# Patient Record
Sex: Male | Born: 1980 | Race: White | Hispanic: No | Marital: Single | Smoking: Current every day smoker
Health system: Southern US, Community
[De-identification: ages and names within clinical notes are randomized; demographics above are authoritative.]

## PROBLEM LIST (undated history)

## (undated) DIAGNOSIS — N200 Calculus of kidney: Secondary | ICD-10-CM

## (undated) DIAGNOSIS — N289 Disorder of kidney and ureter, unspecified: Secondary | ICD-10-CM

---

## 2013-07-08 ENCOUNTER — Emergency Department (HOSPITAL_BASED_OUTPATIENT_CLINIC_OR_DEPARTMENT_OTHER)
Admission: EM | Admit: 2013-07-08 | Discharge: 2013-07-08 | Disposition: A | Discharge status: Home / Self Care | Payer: Self-pay | Attending: Emergency Medicine | Admitting: Emergency Medicine

## 2013-07-08 ENCOUNTER — Encounter (HOSPITAL_BASED_OUTPATIENT_CLINIC_OR_DEPARTMENT_OTHER): Payer: Self-pay | Admitting: Emergency Medicine

## 2013-07-08 DIAGNOSIS — F17200 Nicotine dependence, unspecified, uncomplicated: Secondary | ICD-10-CM | POA: Insufficient documentation

## 2013-07-08 DIAGNOSIS — K029 Dental caries, unspecified: Secondary | ICD-10-CM | POA: Insufficient documentation

## 2013-07-08 DIAGNOSIS — K089 Disorder of teeth and supporting structures, unspecified: Secondary | ICD-10-CM | POA: Insufficient documentation

## 2013-07-08 MED ORDER — PENICILLIN V POTASSIUM 500 MG PO TABS: 500.0000 mg | ORAL_TABLET | Freq: Three times a day (TID) | ORAL | Status: DC

## 2013-07-08 MED ORDER — OXYCODONE-ACETAMINOPHEN 5-325 MG PO TABS: 2.0000 | ORAL_TABLET | ORAL | Status: DC | PRN

## 2013-07-08 NOTE — Discharge Instructions (Signed)
Penicillin as prescribed.  Percocet as needed for as prescribed for pain.  Followup with dentistry as scheduled.   Dental Caries  Dental caries (also called tooth decay) is the most common oral disease. It can occur at any age, but is more common in children and young adults.  HOW DENTAL CARIES DEVELOPS  The process of decay begins when bacteria and foods (particularly sugars and starches) combine in your mouth to produce plaque. Plaque is a substance that sticks to the hard, outer surface of a tooth (enamel). The bacteria in plaque produce acids that attack enamel. These acids may also attack the root surface of a tooth (cementum) if it is exposed. Repeated attacks dissolve these surfaces and create holes in the tooth (cavities). If left untreated, the acids destroy the other layers of the tooth.  RISK FACTORS  Frequent sipping of sugary beverages.   Frequent snacking on sugary and starchy foods, especially those that easily get stuck in the teeth.   Poor oral hygiene.   Dry mouth.   Substance abuse such as methamphetamine abuse.   Broken or poor-fitting dental restorations.   Eating disorders.   Gastroesophageal reflux disease (GERD).   Certain radiation treatments to the head and neck. SYMPTOMS In the early stages of dental caries, symptoms are seldom present. Sometimes white, chalky areas may be seen on the enamel or other tooth layers. In later stages, symptoms may include:  Pits and holes on the enamel.  Toothache after sweet, hot, or cold foods or drinks are consumed.  Pain around the tooth.  Swelling around the tooth. DIAGNOSIS  Most of the time, dental caries is detected during a regular dental checkup. A diagnosis is made after a thorough medical and dental history is taken and the surfaces of your teeth are checked for signs of dental caries. Sometimes special instruments, such as lasers, are used to check for dental caries. Dental X-ray exams may be taken  so that areas not visible to the eye (such as between the contact areas of the teeth) can be checked for cavities.  TREATMENT  If dental caries is in its early stages, it may be reversed with a fluoride treatment or an application of a remineralizing agent at the dental office. Thorough brushing and flossing at home is needed to aid these treatments. If it is in its later stages, treatment depends on the location and extent of tooth destruction:   If a small area of the tooth has been destroyed, the destroyed area will be removed and cavities will be filled with a material such as gold, silver amalgam, or composite resin.   If a large area of the tooth has been destroyed, the destroyed area will be removed and a cap (crown) will be fitted over the remaining tooth structure.   If the center part of the tooth (pulp) is affected, a procedure called a root canal will be needed before a filling or crown can be placed.   If most of the tooth has been destroyed, the tooth may need to be pulled (extracted). HOME CARE INSTRUCTIONS You can prevent, stop, or reverse dental caries at home by practicing good oral hygiene. Good oral hygiene includes:  Thoroughly cleaning your teeth at least twice a day with a toothbrush and dental floss.   Using a fluoride toothpaste. A fluoride mouth rinse may also be used if recommended by your dentist or health care provider.   Restricting the amount of sugary and starchy foods and sugary liquids you  consume.   Avoiding frequent snacking on these foods and sipping of these liquids.   Keeping regular visits with a dentist for checkups and cleanings. PREVENTION   Practice good oral hygiene.  Consider a dental sealant. A dental sealant is a coating material that is applied by your dentist to the pits and grooves of teeth. The sealant prevents food from being trapped in them. It may protect the teeth for several years.  Ask about fluoride supplements if you live  in a community without fluorinated water or with water that has a low fluoride content. Use fluoride supplements as directed by your dentist or health care provider.  Allow fluoride varnish applications to teeth if directed by your dentist or health care provider. Document Released: 01/29/2002 Document Revised: 01/09/2013 Document Reviewed: 05/11/2012 [facility] Patient Information 2014 [place], [place].    Emergency Department Resource Guide 1) Find a Doctor and Pay Out of Pocket Although you won't have to find out who is covered by your insurance plan, it is a good idea to ask around and get recommendations. You will then need to call the office and see if the doctor you have chosen will accept you as a new patient and what types of options they offer for patients who are self-pay. Some doctors offer discounts or will set up payment plans for their patients who do not have insurance, but you will need to ask so you aren't surprised when you get to your appointment.  2) Contact Your Local Health Department Not all health departments have doctors that can see patients for sick visits, but many do, so it is worth a call to see if yours does. If you don't know where your local health department is, you can check in your phone book. The CDC also has a tool to help you locate your state's health department, and many state websites also have listings of all of their local health departments.  3) Find a Walk-in Clinic If your illness is not likely to be very severe or complicated, you may want to try a walk in clinic. These are popping up all over the country in pharmacies, drugstores, and shopping centers. They're usually staffed by nurse practitioners or physician assistants that have been trained to treat common illnesses and complaints. They're usually fairly quick and inexpensive. However, if you have serious medical issues or chronic medical problems, these are probably not your best option.  No  Primary Care Doctor: - Call Health Connect at  [phone] - they can help you locate a primary care doctor that  accepts your insurance, provides certain services, etc. - Physician Referral Service- [phone]  Chronic Pain Problems: Organization         Address  Phone   Notes  [facility]  [phone] Patients need to be referred by their primary care doctor.   Medication Assistance: Organization         Address  Phone   Notes  [facility] Medication [facility] [address] [place], [place] [phone]) [phone] --Must be a resident of [place] [place] [facility] -- Must have NO insurance coverage whatsoever (no Medicaid/ Medicare, etc.) -- The pt. MUST have a primary care doctor that directs their care regularly and follows them in the community   [facility]ist  [phone]   [employer]  [phone]    Agencies that provide inexpensive medical care: Retail buyerrganization         Address  Phone  Notes  [facility]  [phone]   [facility]    [phone]   [facility] [address] [place], [place] [address] [phone]   [facility] [address], [place] [phone]   Planned Parenthood    [phone]   [facility]    [phone]   Community [facility] and [facility]  [address], [place] Phone:  [phone], Fax:  [phone] Hours of Operation:  9 am - 6 pm, M-F.  Also accepts Medicaid/Medicare and self-pay.  [facility]  [address], [place] Phone: [phone], Fax: [phone]. Hours of Operation:  8:30 am - 5:30 pm, M-F.  Also accepts Medicaid and self-pay.  [facility] [place] [address] [place] Phone: [phone]   [facility] [address] Natasha Bence [place], [place] [phone], Ext. [phone] Mondays & Thursdays: 7-9 AM.  First 15 patients are seen on  a first come, first serve basis.    Medicaid-accepting [facility] Providers:  Organization         Address  Phone   Notes  [facility] [address], [place] [phone] Also accepts self-pay patients.  [facility] [address] Laurell Josephs [place], [place]  [phone]   [facility] [address], [place] [phone]   [facility] [address] [place] [phone]   Renaye Rakers [address], [place]   [phone] Only accepts [place] Access [place] patients after they have their name applied to their card.   Self-Pay (no insurance) in [facility]:  Organization         Address  Phone   Notes  Sickle Cell Patients, [facility] [address] [place], [place] [phone]   [facility] [address] [place], [place] [phone]   [facility] [place]  [address] [place] [address], [place] [phone]   [facility]/Dr. Osei-Bonsu  [address], [place] or [address], [place] [phone] Phone number for both [place] and [place] locations is the same.  Urgent Medical and [facility] [address], [place] [phone]   [facility] [address] [place] or [address] [phone]   [facility] [address], [place] [phone], phone; [phone], fax Sees patients 1st and 3rd Saturday of every month.  Must not qualify for public or private insurance (i.e. Medicaid, Medicare, [place] Health Choice, Veterans' Benefits)  Household income should be no more than 200% of the poverty level The clinic cannot treat you if you are pregnant or think you are pregnant  Sexually transmitted diseases are not treated at the clinic.    Dental Care: Organization          Address  Phone  Notes  [facility] Department of [facility] [address] [place], [place] [phone] Accepts children up to age 27 who are enrolled in [place] or [place] Health Choice; pregnant women with a Medicaid card; and children who have applied for Medicaid or [place] Health Choice, but were declined, whose parents can pay a reduced fee at time of service.  [facility]  Department of [facility]  [address], [place] [phone] Accepts children up to age 53 who are enrolled in [place] or [place] Health Choice; pregnant women with a Medicaid card; and children who have applied for Medicaid or [place] Health Choice, but were declined, whose parents can pay a reduced fee at time of service.  [facility]  [address] [place], [place] [phone] Patients are seen by appointment only. Walk-ins are not accepted. [facility] will see patients 17 years of age and older. Monday - Tuesday (8am-5pm) Most Wednesdays (8:30-5pm) $30 per visit, cash only  [facility] Adult Dental Access PROGRAM  [address], [facility] [phone] Patients are seen by appointment only. Walk-ins are not accepted. [facility] will see patients 22 years of age and older. One Wednesday Evening (Monthly: Volunteer Based).  $30 per visit, cash only  [employer]  [phone] for adults; Children under age 29, call Graduate Pediatric Dentistry at [phone]. Children aged 39-14, please call [phone] to request a pediatric application.  Dental services are provided in all areas of dental care including fillings, crowns and bridges, complete and partial dentures, implants, gum treatment, root canals, and extractions. Preventive care is also provided. Treatment is provided to both adults and children. Patients are selected via a lottery and there is often a waiting list.   [facility] [address] [place]  [phone] [email or web address]   [facility] [address] [place], [place] [phone], Ext. [phone] Second and Fourth Thursday of each month, opens at 6:30 AM; Clinic ends at 9 AM.  Patients are seen on a first-come first-served basis, and a limited number are seen during each clinic.   [facility]  [address] Ether Griffins [place], [place] [phone]   Eligibility Requirements You must have lived in [place], [place], or [place] counties for at least the last three months.   You cannot be eligible for state or federal sponsored [place], including [employer], [place], or [employer].   You generally cannot be eligible for healthcare insurance through your employer.    How to apply: Eligibility screenings are held every Tuesday and Wednesday afternoon from 1:00 pm until 4:00 pm. You do not need an appointment for the interview!  [facility] [address], [place], [place] [phone]   [facility] Health Department  [phone]   [facility] Health Department  [phone]   [facility] Health Department  [phone]    Behavioral Health Resources in the Community: Intensive Outpatient Programs Organization         Address  Phone  Notes  [facility] [address] [place], [place] [phone]   [facility] Outpatient [address] [place], [place] [phone]   [facility]: [facility] [address], [place], [place]  [phone]   [facility] Mental Health [address]  [place], [place] [phone] or [phone]   Substance Abuse Resources Organization         Address  Phone  Notes  Al[facility] Drug Services  [phone]   [facility]  [phone]   [facility]  [phone]   Floydene Flock  [phone]   Residential & Outpatient Substance Abuse Program  [phone]   Psychological  Services Organization         Address  Phone  Notes  [facility]  [phone]   [facility] Services  [phone]   [facility] Mental Health [address] [place] [phone] or [phone]    Mobile Crisis Teams Organization         Address  Phone  Notes  [facility], Mobile Crisis Care Unit  [phone]   [facility]  [address] [place] [address] [place], [place] [phone]   Doristine Locks [address] [place] [phone]    Self-Help/Support Groups Organization         Address  Phone             Notes  [facility] [place] - variety of support groups  336- I7437963 Call for more information  Narcotics Anonymous (NA), [facility] [address], [employer] [place]  2 meetings at this location   Statistician         Address  Phone  Notes  [facility] [address],    [place]  [phone]   [facility]  [address] [place] [address], [place], [place] [phone]   [facility] [address] [place], [place] [phone] Admissions: 8am-3pm M-F  [facility] [address],    [place], [place] [phone]   [facility] [address] [place], [place], [place] [phone]   [facility] [address]  [place], [place] [phone]   [facility] - Intensive Outpatient [address] Laurell Josephs [address], [place], [place] [phone]   [facility] ([facility]iction Recovery [facility] [address] [place]  [place], [place] [phone] or [phone]   [facility] ([facility]) [address] [place], [place] [phone] Accepts Medicaid  [facility] [address],  [place] [phone] Substance Abuse/Addiction Treatment   [facility] Organization         Address  Phone  Notes  [facility]  [phone]   Angie Fava, PhD [address] Ervin Knack [place], [place]   [phone] or [phone]   [facility]al   [address] [place], [place] [phone]   [facility] [address] [place], [place] [phone] Insurance/Medicaid/sponsorship through [facility] and Families [address]                                    [place], [place] [phone] Therapy/tele-psych/case  [facility] [address], [place] [phone]    Dr. Lolly Mustache  [phone]   [facility]  United [employer] [facility] Dept. 1) [address], [place] 2) [address] [place] 3)  [address], [place] [phone]  [phone]   [facility] Child Abuse Hotline [phone] or [phone] (After Hours)

## 2013-07-08 NOTE — ED Notes (Signed)
Pt states he chipped his front tooth day before new years and is becoming more sensitive especially with the cold air

## 2013-07-08 NOTE — ED Provider Notes (Signed)
CSN: [id number]     Arrival date & time 07/08/13  1223 History   First MD Initiated Contact with Patient 07/08/13 1316     Chief Complaint  Patient presents with  . Dental Pain     (Consider location/radiation/quality/duration/timing/severity/associated sxs/prior Treatment) Patient is a 33 y.o. male presenting with tooth pain. The history is provided by the patient.  Dental Pain Location:  Upper Upper teeth location:  7/RU lateral incisor and 6/RU cuspid Quality:  Throbbing Severity:  Severe Onset quality:  Gradual Duration:  3 weeks Timing:  Constant Progression:  Worsening Chronicity:  New Context: poor dentition   Relieved by:  Nothing Worsened by:  Nothing tried Ineffective treatments:  None tried Associated symptoms: gum swelling     History reviewed. No pertinent past medical history. History reviewed. No pertinent past surgical history. History reviewed. No pertinent family history. History  Substance Use Topics  . Smoking status: Current Every Day Smoker  . Smokeless tobacco: Not on file  . Alcohol Use: Yes    Review of Systems  All other systems reviewed and are negative.      Allergies  Review of patient's allergies indicates no known allergies.  Home Medications  No current outpatient prescriptions on file. BP 144/79  Pulse 85  Temp(Src) 97.9 F (36.6 C) (Oral)  Resp 16  Ht 5\' 11"  (1.803 m)  Wt 160 lb (72.576 kg)  BMI 22.33 kg/m2  SpO2 99% Physical Exam  Nursing note and vitals reviewed. Constitutional: He is oriented to person, place, and time. He appears well-developed and well-nourished. No distress.  HENT:  Head: Normocephalic and atraumatic.   there are multiple heavily decayed teeth. The right upper lateral incisor and canine are particularly bad. There is surrounding gingival inflammation but no definite abscess.   Neck: Normal range of motion. Neck supple.  Musculoskeletal: Normal range of motion.  Lymphadenopathy:    He has no  cervical adenopathy.  Neurological: He is alert and oriented to person, place, and time.  Skin: Skin is warm and dry. He is not diaphoretic.    ED Course  Procedures (including critical care time) Labs Review Labs Reviewed - No data to display Imaging Review No results found.    MDM   Final diagnoses:  None    Will treat with antibiotics and pain medication. Patient has multiple heavily decayed teeth that will likely require extraction. He states he has a Education officer, communitydentist in [place] he is to followup with soon. I've advised him to keep this appointment.    Geoffery Lyonsouglas Grier Czerwinski, MD 07/08/13 1325

## 2013-10-25 ENCOUNTER — Emergency Department (HOSPITAL_BASED_OUTPATIENT_CLINIC_OR_DEPARTMENT_OTHER)
Admission: EM | Admit: 2013-10-25 | Discharge: 2013-10-25 | Disposition: A | Discharge status: Home / Self Care | Payer: Self-pay | Attending: Emergency Medicine | Admitting: Emergency Medicine

## 2013-10-25 ENCOUNTER — Encounter (HOSPITAL_BASED_OUTPATIENT_CLINIC_OR_DEPARTMENT_OTHER): Payer: Self-pay | Admitting: Emergency Medicine

## 2013-10-25 DIAGNOSIS — K0889 Other specified disorders of teeth and supporting structures: Secondary | ICD-10-CM

## 2013-10-25 DIAGNOSIS — F17200 Nicotine dependence, unspecified, uncomplicated: Secondary | ICD-10-CM | POA: Insufficient documentation

## 2013-10-25 DIAGNOSIS — K089 Disorder of teeth and supporting structures, unspecified: Secondary | ICD-10-CM | POA: Insufficient documentation

## 2013-10-25 MED ORDER — CLINDAMYCIN HCL 300 MG PO CAPS: ORAL_CAPSULE | ORAL | Status: DC

## 2013-10-25 MED ORDER — HYDROCODONE-ACETAMINOPHEN 5-325 MG PO TABS: 2.0000 | ORAL_TABLET | ORAL | Status: DC | PRN

## 2013-10-25 NOTE — ED Notes (Signed)
Pt having left sided facial swelling since yesterday.  Painful chewing on both sides.

## 2013-10-25 NOTE — ED Provider Notes (Signed)
CSN: [id number]     Arrival date & time 10/25/13  1151 History   First MD Initiated Contact with Patient 10/25/13 1206     Chief Complaint  Patient presents with  . Facial Swelling     (Consider location/radiation/quality/duration/timing/severity/associated sxs/prior Treatment) Patient is a 33 y.o. male presenting with tooth pain. The history is provided by the patient. No language interpreter was used.  Dental Pain Location:  Upper Quality:  No pain Severity:  No pain Onset quality:  Gradual Timing:  Constant Progression:  Worsening Chronicity:  New Previous work-up:  Dental exam Worsened by:  Nothing tried Ineffective treatments:  None tried Associated symptoms: facial swelling   Risk factors: no alcohol problem     No past medical history on file. No past surgical history on file. No family history on file. History  Substance Use Topics  . Smoking status: Current Every Day Smoker  . Smokeless tobacco: Not on file  . Alcohol Use: Yes    Review of Systems  HENT: Positive for facial swelling.   All other systems reviewed and are negative.     Allergies  Review of patient's allergies indicates no known allergies.  Home Medications   Prior to Admission medications   Medication Sig Start Date End Date Taking? Authorizing Provider  clindamycin (CLEOCIN) 300 MG capsule One po qid 10/25/13   Elson Areas, PA-C  HYDROcodone-acetaminophen (NORCO/VICODIN) 5-325 MG per tablet Take 2 tablets by mouth every 4 (four) hours as needed. 10/25/13   Elson Areas, PA-C   BP 129/80  Pulse 68  Temp(Src) 98.4 F (36.9 C) (Oral)  Resp 18  Ht 5\' 11"  (1.803 m)  Wt 165 lb (74.844 kg)  BMI 23.02 kg/m2  SpO2 100% Physical Exam  Nursing note and vitals reviewed. Constitutional: He appears well-developed and well-nourished.  HENT:  Head: Normocephalic and atraumatic.  Swelling left face,   Eyes: Conjunctivae are normal. Pupils are equal, round, and reactive to light.  Neck:  Normal range of motion.  Cardiovascular: Normal rate.   Pulmonary/Chest: Effort normal.  Musculoskeletal: Normal range of motion.  Neurological: He is alert.  Skin: Skin is warm.    ED Course  Procedures (including critical care time) Labs Review Labs Reviewed - No data to display  Imaging Review No results found.   EKG Interpretation None      MDM   Final diagnoses:  Toothache   Clindamycin Hydrocodone farless or civils dentistry     Elson Areas, PA-C 10/25/13 1243

## 2013-10-25 NOTE — Discharge Instructions (Signed)

## 2013-10-28 NOTE — ED Provider Notes (Signed)
History/physical exam/procedure(s) were performed by non-physician practitioner and as supervising physician I was immediately available for consultation/collaboration. I have reviewed all notes and am in agreement with care and plan.   Hilario Quarry, MD 10/28/13 1003

## 2014-10-12 ENCOUNTER — Emergency Department (HOSPITAL_BASED_OUTPATIENT_CLINIC_OR_DEPARTMENT_OTHER)
Admission: EM | Admit: 2014-10-12 | Discharge: 2014-10-12 | Disposition: A | Discharge status: Home / Self Care | Payer: Medicaid Other | Attending: Emergency Medicine | Admitting: Emergency Medicine

## 2014-10-12 ENCOUNTER — Encounter (HOSPITAL_BASED_OUTPATIENT_CLINIC_OR_DEPARTMENT_OTHER): Payer: Self-pay | Admitting: Family Medicine

## 2014-10-12 DIAGNOSIS — Z792 Long term (current) use of antibiotics: Secondary | ICD-10-CM | POA: Diagnosis not present

## 2014-10-12 DIAGNOSIS — Z720 Tobacco use: Secondary | ICD-10-CM | POA: Diagnosis not present

## 2014-10-12 DIAGNOSIS — R21 Rash and other nonspecific skin eruption: Secondary | ICD-10-CM | POA: Insufficient documentation

## 2014-10-12 DIAGNOSIS — Z79899 Other long term (current) drug therapy: Secondary | ICD-10-CM | POA: Diagnosis not present

## 2014-10-12 MED ORDER — HYDROXYZINE HCL 25 MG PO TABS: 25.0000 mg | ORAL_TABLET | Freq: Four times a day (QID) | ORAL | Status: DC

## 2014-10-12 MED ORDER — TRIAMCINOLONE ACETONIDE 0.1 % EX CREA: 1.0000 "application " | TOPICAL_CREAM | Freq: Two times a day (BID) | CUTANEOUS | Status: DC

## 2014-10-12 NOTE — ED Provider Notes (Signed)
CSN: [id number]     Arrival date & time 10/12/14  1129 History   First MD Initiated Contact with Patient 10/12/14 1224     Chief Complaint  Patient presents with  . Skin Problem      HPI  Patient evaluation of burning skin. He states that intermittently for the last several months has had some discomfort described as burning and occasional itching of the skin of his right back from his shoulder blade to just below his lower ribs. States it is typically on the right has a small area "about the size of a half dollar" on the left. His skin is erythematous in this area. His never had vesicles or blisters. His never had documented shingles. No new exposures with seeding, clothing, chemicals, hygiene products. No other areas of concern.  History reviewed. No pertinent past medical history. History reviewed. No pertinent past surgical history. No family history on file. History  Substance Use Topics  . Smoking status: Current Every Day Smoker    Types: Cigarettes  . Smokeless tobacco: Not on file  . Alcohol Use: Yes    Review of Systems  Constitutional: Negative for fever, chills, diaphoresis, appetite change and fatigue.  HENT: Negative for mouth sores, sore throat and trouble swallowing.   Eyes: Negative for visual disturbance.  Respiratory: Negative for cough, chest tightness, shortness of breath and wheezing.   Cardiovascular: Negative for chest pain.  Gastrointestinal: Negative for nausea, vomiting, abdominal pain, diarrhea and abdominal distention.  Endocrine: Negative for polydipsia, polyphagia and polyuria.  Genitourinary: Negative for dysuria, frequency and hematuria.  Musculoskeletal: Negative for gait problem.  Skin: Positive for color change. Negative for pallor and rash.  Neurological: Negative for dizziness, syncope, light-headedness and headaches.  Hematological: Does not bruise/bleed easily.  Psychiatric/Behavioral: Negative for behavioral problems and confusion.       Allergies  Review of patient's allergies indicates no known allergies.  Home Medications   Prior to Admission medications   Medication Sig Start Date End Date Taking? Authorizing Provider  clindamycin (CLEOCIN) 300 MG capsule One po qid 10/25/13   Elson Areas, PA-C  HYDROcodone-acetaminophen (NORCO/VICODIN) 5-325 MG per tablet Take 2 tablets by mouth every 4 (four) hours as needed. 10/25/13   Elson Areas, PA-C  hydrOXYzine (ATARAX/VISTARIL) 25 MG tablet Take 1 tablet (25 mg total) by mouth every 6 (six) hours. 10/12/14   Rolland Porter, MD  triamcinolone cream (KENALOG) 0.1 % Apply 1 application topically 2 (two) times daily. 10/12/14   Rolland Porter, MD   BP 123/82 mmHg  Pulse 65  Temp(Src) 97.5 F (36.4 C) (Oral)  Resp 16  Ht  (1.803 m)  Wt 170 lb (77.111 kg)  BMI 23.72 kg/m2  SpO2 99% Physical Exam  Constitutional: He is oriented to person, place, and time. He appears well-developed and well-nourished. No distress.  HENT:  Head: Normocephalic.  Eyes: Conjunctivae are normal. Pupils are equal, round, and reactive to light. No scleral icterus.  Neck: Normal range of motion. Neck supple. No thyromegaly present.  Cardiovascular: Normal rate and regular rhythm.  Exam reveals no gallop and no friction rub.   No murmur heard. Pulmonary/Chest: Effort normal and breath sounds normal. No respiratory distress. He has no wheezes. He has no rales.    Abdominal: Soft. Bowel sounds are normal. He exhibits no distension. There is no tenderness. There is no rebound.  Musculoskeletal: Normal range of motion.  Neurological: He is alert and oriented to person, place, and time.  Skin:  Skin is warm and dry. No rash noted.  Psychiatric: He has a normal mood and affect. His behavior is normal.    ED Course  Procedures (including critical care time) Labs Review Labs Reviewed - No data to display  Imaging Review No results found.   EKG Interpretation None      MDM   Final  diagnoses:  Rash and nonspecific skin eruption    I do not have an exact etiology for his symptoms. He has never had vesicles documented for this to be postherpetic neuralgia. He has abnormal appearance the skin with erythema. He has a small area that is left of midline, thus doubt shingles, postherpetic neuralgia. No exact cause to be determined for a possible contact dermatitis. No radicular symptoms. Plan will be topical hydrocortisone cream. Dermatology follow-up.    Rolland PorterMark Glanda Spanbauer, MD 10/12/14 [phone]

## 2014-10-12 NOTE — Discharge Instructions (Signed)
No specific diagnosis is made for your rash and skin pain today.  Rashes does not appear to be an infection, or shingles.  Patient attention to things in your environment to be certain there is not something that could be coming into contact with your skin.  Contacted a dermatologist for reevaluation.

## 2014-10-12 NOTE — ED Notes (Signed)
Pt c/o right side of back feeling like it's "burning" for 3 months constant but no rash has developed.

## 2014-10-12 NOTE — ED Notes (Signed)
Pt c/o burning sensation for pain at base of Left scapula, some redness noted. States has pain for the past "few" months.

## 2014-10-12 NOTE — ED Notes (Signed)
MD at bedside. 

## 2015-07-14 ENCOUNTER — Encounter (HOSPITAL_BASED_OUTPATIENT_CLINIC_OR_DEPARTMENT_OTHER): Payer: Self-pay | Admitting: Emergency Medicine

## 2015-07-14 ENCOUNTER — Emergency Department (HOSPITAL_BASED_OUTPATIENT_CLINIC_OR_DEPARTMENT_OTHER): Payer: Worker's Compensation

## 2015-07-14 ENCOUNTER — Emergency Department (HOSPITAL_BASED_OUTPATIENT_CLINIC_OR_DEPARTMENT_OTHER)
Admission: EM | Admit: 2015-07-14 | Discharge: 2015-07-14 | Disposition: A | Discharge status: Home / Self Care | Payer: Worker's Compensation | Attending: Emergency Medicine | Admitting: Emergency Medicine

## 2015-07-14 DIAGNOSIS — S60132A Contusion of left middle finger with damage to nail, initial encounter: Secondary | ICD-10-CM | POA: Insufficient documentation

## 2015-07-14 DIAGNOSIS — S62633A Displaced fracture of distal phalanx of left middle finger, initial encounter for closed fracture: Secondary | ICD-10-CM

## 2015-07-14 DIAGNOSIS — Z7952 Long term (current) use of systemic steroids: Secondary | ICD-10-CM | POA: Diagnosis not present

## 2015-07-14 DIAGNOSIS — IMO0001 Reserved for inherently not codable concepts without codable children: Secondary | ICD-10-CM

## 2015-07-14 DIAGNOSIS — Y9289 Other specified places as the place of occurrence of the external cause: Secondary | ICD-10-CM | POA: Diagnosis not present

## 2015-07-14 DIAGNOSIS — S6010XA Contusion of unspecified finger with damage to nail, initial encounter: Secondary | ICD-10-CM

## 2015-07-14 DIAGNOSIS — S62635A Displaced fracture of distal phalanx of left ring finger, initial encounter for closed fracture: Secondary | ICD-10-CM | POA: Diagnosis not present

## 2015-07-14 DIAGNOSIS — F17210 Nicotine dependence, cigarettes, uncomplicated: Secondary | ICD-10-CM | POA: Insufficient documentation

## 2015-07-14 DIAGNOSIS — S62637A Displaced fracture of distal phalanx of left little finger, initial encounter for closed fracture: Secondary | ICD-10-CM | POA: Insufficient documentation

## 2015-07-14 DIAGNOSIS — W230XXA Caught, crushed, jammed, or pinched between moving objects, initial encounter: Secondary | ICD-10-CM | POA: Insufficient documentation

## 2015-07-14 DIAGNOSIS — Y990 Civilian activity done for income or pay: Secondary | ICD-10-CM | POA: Diagnosis not present

## 2015-07-14 DIAGNOSIS — Y9389 Activity, other specified: Secondary | ICD-10-CM | POA: Insufficient documentation

## 2015-07-14 DIAGNOSIS — S6992XA Unspecified injury of left wrist, hand and finger(s), initial encounter: Secondary | ICD-10-CM | POA: Diagnosis present

## 2015-07-14 MED ORDER — OXYCODONE-ACETAMINOPHEN 5-325 MG PO TABS
1.0000 | ORAL_TABLET | Freq: Once | ORAL | Status: AC
  Administered 2015-07-14: 1 via ORAL
  Filled 2015-07-14: qty 1

## 2015-07-14 MED ORDER — TETANUS-DIPHTH-ACELL PERTUSSIS 5-2.5-18.5 LF-MCG/0.5 IM SUSP
0.5000 mL | Freq: Once | INTRAMUSCULAR | Status: AC
  Administered 2015-07-14: 0.5 mL via INTRAMUSCULAR
  Filled 2015-07-14: qty 0.5

## 2015-07-14 MED ORDER — OXYCODONE-ACETAMINOPHEN 5-325 MG PO TABS: 1.0000 | ORAL_TABLET | Freq: Four times a day (QID) | ORAL | Status: DC | PRN

## 2015-07-14 NOTE — Discharge Instructions (Signed)
You have broken the tip of your 3rd and 4th fingers.  Follow instruction below. Take pain medication as needed.  Follow up with hand specialist for further management.    Finger Fracture Fractures of fingers are breaks in the bones of the fingers. There are many types of fractures. There are different ways of treating these fractures. Your health care provider will discuss the best way to treat your fracture. CAUSES Traumatic injury is the main cause of broken fingers. These include:  Injuries while playing sports.  Workplace injuries.  Falls. RISK FACTORS Activities that can increase your risk of finger fractures include:  Sports.  Workplace activities that involve machinery.  A condition called osteoporosis, which can make your bones less dense and cause them to fracture more easily. SIGNS AND SYMPTOMS The main symptoms of a broken finger are pain and swelling within 15 minutes after the injury. Other symptoms include:  Bruising of your finger.  Stiffness of your finger.  Numbness of your finger.  Exposed bones (compound fracture) if the fracture is severe. DIAGNOSIS  The best way to diagnose a broken bone is with X-ray imaging. Additionally, your health care provider will use this X-ray image to evaluate the position of the broken finger bones.  TREATMENT  Finger fractures can be treated with:   Nonreduction--This means the bones are in place. The finger is splinted without changing the positions of the bone pieces. The splint is usually left on for about a week to 10 days. This will depend on your fracture and what your health care provider thinks.  Closed reduction--The bones are put back into position without using surgery. The finger is then splinted.  Open reduction and internal fixation--The fracture site is opened. Then the bone pieces are fixed into place with pins or some type of hardware. This is seldom required. It depends on the severity of the fracture. HOME  CARE INSTRUCTIONS   Follow your health care provider's instructions regarding activities, exercises, and physical therapy.  Only take over-the-counter or prescription medicines for pain, discomfort, or fever as directed by your health care provider. SEEK MEDICAL CARE IF: You have pain or swelling that limits the motion or use of your fingers. SEEK IMMEDIATE MEDICAL CARE IF:  Your finger becomes numb. MAKE SURE YOU:   Understand these instructions.  Will watch your condition.  Will get help right away if you are not doing well or get worse.   This information is not intended to replace advice given to you by your health care provider. Make sure you discuss any questions you have with your health care provider.   Document Released: 08/21/2000 Document Revised: 02/27/2013 Document Reviewed: 12/19/2012 Elsevier Interactive Patient Education 2016 [employer]  Subungual Hematoma  A subungual hematoma is a pocket of blood under the fingernail or toenail. The nail may turn blue or feel painful. HOME CARE  Put ice on the injured area.  Put ice in a plastic bag.  Place a towel between your skin and the bag.  Leave the ice on for 15-20 minutes, 03-04 times a day. Do this for the first 1 to 2 days.  Raise (elevate) the injured area to lessen pain and puffiness (swelling).  If you were given a bandage, wear it for as long as told by your doctor.  If part of your nail falls off, trim the rest of the nail gently.  Only take medicines as told by your doctor. GET HELP RIGHT AWAY IF:  You have redness  or puffiness around the nail.  You have yellowish-white fluid (pus) coming from the nail.  Your pain does not get better with medicine.  You have a fever. MAKE SURE YOU:  Understand these instructions.  Will watch your condition.  Will get help right away if you are not doing well or get worse.   This information is not intended to replace advice given to you by your health  care provider. Make sure you discuss any questions you have with your health care provider.   Document Released: 08/01/2011 Document Reviewed: 09/24/2014 Elsevier Interactive Patient Education [employer]

## 2015-07-14 NOTE — ED Notes (Signed)
Hand pain  After getting hand caught in a tire at work

## 2015-07-14 NOTE — ED Provider Notes (Signed)
CSN: [id number]     Arrival date & time 07/14/15  1727 History   First MD Initiated Contact with Patient 07/14/15 1749     Chief Complaint  Patient presents with  . Hand Injury     (Consider location/radiation/quality/duration/timing/severity/associated sxs/prior Treatment) HPI   35 year old male who presents for evaluation of left hand injury. Patient reported approximately 2 hours ago he was workking at work changing tires. After he inflated the tire and about to place it onto the car when the tire pops and pushed straight towards his left hand. He report acute onset of sharp pain involving the entire hand and wrist. Pain is intense, severity is moderate with now persistent throbbing sensation worsening with palpation or with movement. He denies any elbow or shoulder pain. He is right-hand dominant. He cannot recall his last tetanus shot. He has not had any recent treatment. He denies having any numbness.  History reviewed. No pertinent past medical history. History reviewed. No pertinent past surgical history. History reviewed. No pertinent family history. Social History  Substance Use Topics  . Smoking status: Current Every Day Smoker    Types: Cigarettes  . Smokeless tobacco: None  . Alcohol Use: Yes    Review of Systems  Constitutional: Negative for fever.  Musculoskeletal: Positive for joint swelling and arthralgias.  Skin: Positive for wound.  Neurological: Negative for numbness.      Allergies  Review of patient's allergies indicates no known allergies.  Home Medications   Prior to Admission medications   Medication Sig Start Date End Date Taking? Authorizing Provider  clindamycin (CLEOCIN) 300 MG capsule One po qid 10/25/13   Elson Areas, PA-C  HYDROcodone-acetaminophen (NORCO/VICODIN) 5-325 MG per tablet Take 2 tablets by mouth every 4 (four) hours as needed. 10/25/13   Elson Areas, PA-C  hydrOXYzine (ATARAX/VISTARIL) 25 MG tablet Take 1 tablet (25 mg total) by  mouth every 6 (six) hours. 10/12/14   Rolland Porter, MD  triamcinolone cream (KENALOG) 0.1 % Apply 1 application topically 2 (two) times daily. 10/12/14   Rolland Porter, MD   BP 158/106 mmHg  Pulse 60  Temp(Src) 98.3 F (36.8 C) (Oral)  Resp 20  Ht  (1.803 m)  Wt 72.576 kg  BMI 22.33 kg/m2  SpO2 98% Physical Exam  Constitutional: He appears well-developed and well-nourished. No distress.  HENT:  Head: Atraumatic.  Eyes: Conjunctivae are normal.  Neck: Neck supple.  Musculoskeletal: He exhibits tenderness (left hand: Tenderness throughout every fingers including the palms of hand and left wrist on palpation most significant to 3rd and 4th fingers.  decrease flexion and extension had the PIP and DIP joint on the affected finger secondary to pain ).  Neurological: He is alert.  Skin: No rash noted.  A subungual hematoma to left middle finger nail, involving approximately 25% of the nail. ED May noted to third and fourth finger with moderate tenderness to palpation and decreased range of motion secondary to pain without obvious deformity. Left wrist is tender with normal flexion and extension and no gross deformity. Radial pulse 2+. Small skin tear noted to dorsum of the finger.  Psychiatric: He has a normal mood and affect.  Nursing note and vitals reviewed.   ED Course  Procedures (including critical care time) Labs Review Labs Reviewed - No data to display  Imaging Review Dg Hand Complete Left  07/14/2015  CLINICAL DATA:  Lt hand pain s/p caught between two tires on the job x today, severe pain left  second through fourth fingers EXAM: LEFT HAND - COMPLETE 3+ VIEW COMPARISON:  None. FINDINGS: Comminuted fracture third distal phalanx, with fracture involving the tuft as well as the radial base of the phalanx which extends into the interphalangeal joint. Fracture fragment involving the tuft is mildly displaced. There is a fracture of the tuft of the fourth distal phalanx. On the lateral  image it appears that fracture extends into the base of the fourth distal phalanx as well. IMPRESSION: Fractures of the third and fourth distal phalanges. Electronically Signed   By: Esperanza Heir M.D.   On: 07/14/2015 18:09   I have personally reviewed and evaluated these images and lab results as part of my medical decision-making.   EKG Interpretation None      MDM   Final diagnoses:  Closed fracture of distal phalanx of fourth finger of left hand, initial encounter  Closed fracture of distal phalanx of third finger of left hand, initial encounter  Subungual hematoma of digit of hand, initial encounter    BP 158/106 mmHg  Pulse 60  Temp(Src) 98.3 F (36.8 C) (Oral)  Resp 20  Ht  (1.803 m)  Wt 72.576 kg  BMI 22.33 kg/m2  SpO2 98%   6:35 PM Patient injured his left nondominant hand when a tire bead popped as he was inflating the tires at work. X-ray demonstrate fractures of the third and fourth distal phalanx. He also has a subungual hematoma to his third finger nail not amenable for trephination at this time. He has pain throughout his left hand and left wrist likely from the jarring impact but no other fracture noted. Fingers will be placed in finger splints, wrist will be place in Velcro wrist splint. These are closed injuries.  I recommend patient to keep hand elevated, take pain medication, and follow-up with hand specialist for further management.  Fayrene Helper, PA-C 07/14/15 1930  Gwyneth Sprout, MD 07/15/15 2007

## 2016-05-12 ENCOUNTER — Encounter (HOSPITAL_BASED_OUTPATIENT_CLINIC_OR_DEPARTMENT_OTHER): Payer: Self-pay | Admitting: *Deleted

## 2016-05-12 ENCOUNTER — Emergency Department (HOSPITAL_BASED_OUTPATIENT_CLINIC_OR_DEPARTMENT_OTHER): Payer: Medicaid Other

## 2016-05-12 ENCOUNTER — Emergency Department (HOSPITAL_BASED_OUTPATIENT_CLINIC_OR_DEPARTMENT_OTHER)
Admission: EM | Admit: 2016-05-12 | Discharge: 2016-05-12 | Disposition: A | Discharge status: Home / Self Care | Payer: Medicaid Other | Attending: Physician Assistant | Admitting: Physician Assistant

## 2016-05-12 DIAGNOSIS — N200 Calculus of kidney: Secondary | ICD-10-CM | POA: Insufficient documentation

## 2016-05-12 DIAGNOSIS — F17210 Nicotine dependence, cigarettes, uncomplicated: Secondary | ICD-10-CM | POA: Insufficient documentation

## 2016-05-12 DIAGNOSIS — R109 Unspecified abdominal pain: Secondary | ICD-10-CM

## 2016-05-12 HISTORY — DX: Disorder of kidney and ureter, unspecified: N28.9

## 2016-05-12 HISTORY — DX: Calculus of kidney: N20.0

## 2016-05-12 LAB — CBC WITH DIFFERENTIAL/PLATELET
BASOS ABS: 0.1 10*3/uL (ref 0.0–0.1)
Basophils Relative: 0 %
Eosinophils Absolute: 0.1 10*3/uL (ref 0.0–0.7)
Eosinophils Relative: 1 %
HEMATOCRIT: 44.6 % (ref 39.0–52.0)
HEMOGLOBIN: 15.3 g/dL (ref 13.0–17.0)
LYMPHS PCT: 33 %
Lymphs Abs: 4.1 10*3/uL — ABNORMAL HIGH (ref 0.7–4.0)
MCH: 32.5 pg (ref 26.0–34.0)
MCHC: 34.3 g/dL (ref 30.0–36.0)
MCV: 94.7 fL (ref 78.0–100.0)
Monocytes Absolute: 1.1 10*3/uL — ABNORMAL HIGH (ref 0.1–1.0)
Monocytes Relative: 9 %
NEUTROS ABS: 7 10*3/uL (ref 1.7–7.7)
NEUTROS PCT: 57 %
Platelets: 236 10*3/uL (ref 150–400)
RBC: 4.71 MIL/uL (ref 4.22–5.81)
RDW: 13.4 % (ref 11.5–15.5)
WBC: 12.4 10*3/uL — AB (ref 4.0–10.5)

## 2016-05-12 LAB — COMPREHENSIVE METABOLIC PANEL
ALT: 27 U/L (ref 17–63)
AST: 22 U/L (ref 15–41)
Albumin: 4.3 g/dL (ref 3.5–5.0)
Alkaline Phosphatase: 50 U/L (ref 38–126)
Anion gap: 9 (ref 5–15)
BILIRUBIN TOTAL: 0.4 mg/dL (ref 0.3–1.2)
BUN: 18 mg/dL (ref 6–20)
CHLORIDE: 103 mmol/L (ref 101–111)
CO2: 27 mmol/L (ref 22–32)
Calcium: 8.8 mg/dL — ABNORMAL LOW (ref 8.9–10.3)
Creatinine, Ser: 1.06 mg/dL (ref 0.61–1.24)
Glucose, Bld: 107 mg/dL — ABNORMAL HIGH (ref 65–99)
POTASSIUM: 3.9 mmol/L (ref 3.5–5.1)
Sodium: 139 mmol/L (ref 135–145)
TOTAL PROTEIN: 7 g/dL (ref 6.5–8.1)

## 2016-05-12 LAB — URINALYSIS, ROUTINE W REFLEX MICROSCOPIC
GLUCOSE, UA: NEGATIVE mg/dL
Ketones, ur: NEGATIVE mg/dL
LEUKOCYTES UA: NEGATIVE
Nitrite: NEGATIVE
PROTEIN: 30 mg/dL — AB
SPECIFIC GRAVITY, URINE: 1.028 (ref 1.005–1.030)
pH: 5.5 (ref 5.0–8.0)

## 2016-05-12 LAB — URINALYSIS, MICROSCOPIC (REFLEX): WBC, UA: NONE SEEN WBC/hpf (ref 0–5)

## 2016-05-12 MED ORDER — ONDANSETRON HCL 4 MG PO TABS: 4.0000 mg | ORAL_TABLET | Freq: Three times a day (TID) | ORAL | 0 refills | Status: AC | PRN

## 2016-05-12 MED ORDER — MORPHINE SULFATE (PF) 4 MG/ML IV SOLN
4.0000 mg | Freq: Once | INTRAVENOUS | Status: AC
  Administered 2016-05-12: 4 mg via INTRAVENOUS
  Filled 2016-05-12: qty 1

## 2016-05-12 MED ORDER — OXYCODONE-ACETAMINOPHEN 5-325 MG PO TABS
1.0000 | ORAL_TABLET | Freq: Four times a day (QID) | ORAL | 0 refills | Status: DC | PRN
Start: 2016-05-12 — End: 2016-05-14

## 2016-05-12 MED ORDER — KETOROLAC TROMETHAMINE 15 MG/ML IJ SOLN
15.0000 mg | Freq: Once | INTRAMUSCULAR | Status: AC
  Administered 2016-05-12: 15 mg via INTRAVENOUS
  Filled 2016-05-12: qty 1

## 2016-05-12 MED ORDER — SODIUM CHLORIDE 0.9 % IV BOLUS (SEPSIS)
1000.0000 mL | Freq: Once | INTRAVENOUS | Status: AC
  Administered 2016-05-12: 1000 mL via INTRAVENOUS

## 2016-05-12 MED ORDER — TAMSULOSIN HCL 0.4 MG PO CAPS: 0.4000 mg | ORAL_CAPSULE | Freq: Every day | ORAL | 0 refills | Status: AC

## 2016-05-12 MED ORDER — ONDANSETRON HCL 4 MG/2ML IJ SOLN
4.0000 mg | Freq: Once | INTRAMUSCULAR | Status: AC
  Administered 2016-05-12: 4 mg via INTRAVENOUS
  Filled 2016-05-12: qty 2

## 2016-05-12 NOTE — Discharge Instructions (Signed)
You have a kidney stone. Please use the medications provided. Return with fever, increased pain or any concerns.

## 2016-05-12 NOTE — ED Provider Notes (Signed)
[facility]   CSN: [id number] Arrival date & time: 05/12/16  1452     History   Chief Complaint Chief Complaint  Patient presents with  . Flank Pain    HPI Joshua Jackson is a 35 y.o. male.  HPI   Patient is a 35 year old male presenting with right flank pain radiating to the right groin. This started while he was at work. Patient has noted that he has not lifted anything heavy. It feels like his usual kidney stone pain. Patient had sudden urge to urinate when this all started. Patient has noted no fevers, no change in appetite, no other symptoms.  Past Medical History:  Diagnosis Date  . Renal disorder   . Renal stones     There are no active problems to display for this patient.   History reviewed. No pertinent surgical history.     Home Medications    Prior to Admission medications   Medication Sig Start Date End Date Taking? Authorizing Provider  ondansetron (ZOFRAN) 4 MG tablet Take 1 tablet (4 mg total) by mouth every 8 (eight) hours as needed for nausea or vomiting. 05/12/16   Leila Schuff Lyn Donnie Panik, MD  oxyCODONE-acetaminophen (PERCOCET/ROXICET) 5-325 MG tablet Take 1 tablet by mouth every 6 (six) hours as needed for severe pain. 05/12/16   Jennessa Trigo Lyn Joleigh Mineau, MD  tamsulosin (FLOMAX) 0.4 MG CAPS capsule Take 1 capsule (0.4 mg total) by mouth daily. 05/12/16   Missy Baksh Lyn Ayron Fillinger, MD    Family History No family history on file.  Social History Social History  Substance Use Topics  . Smoking status: Current Every Day Smoker    Packs/day: 1.00    Types: Cigarettes  . Smokeless tobacco: Never Used  . Alcohol use Yes     Allergies   Patient has no known allergies.   Review of Systems Review of Systems  Constitutional: Negative for activity change.  Respiratory: Negative for shortness of breath.   Cardiovascular: Negative for chest pain.  Gastrointestinal: Positive for abdominal pain.  Genitourinary: Positive for  dysuria and flank pain.  All other systems reviewed and are negative.    Physical Exam Updated Vital Signs BP 118/99 (BP Location: Right Arm)   Pulse 72   Temp 97.7 F (36.5 C) (Oral)   Resp 17   Ht 5\' 11"  (1.803 m)   Wt 170 lb (77.1 kg)   SpO2 99%   BMI 23.71 kg/m   Physical Exam  Constitutional: He is oriented to person, place, and time. He appears well-nourished.  HENT:  Head: Normocephalic.  Eyes: Conjunctivae are normal.  Cardiovascular: Normal rate.   Pulmonary/Chest: Effort normal and breath sounds normal.  Abdominal: Soft. There is no guarding.  Patient complains of diffuse pain, no tenderness on exam.  Neurological: He is oriented to person, place, and time.  Skin: Skin is warm and dry. He is not diaphoretic.  Psychiatric: He has a normal mood and affect. His behavior is normal.     ED Treatments / Results  Labs (all labs ordered are listed, but only abnormal results are displayed) Labs Reviewed  COMPREHENSIVE METABOLIC PANEL - Abnormal; Notable for the following:       Result Value   Glucose, Bld 107 (*)    Calcium 8.8 (*)    All other components within normal limits  CBC WITH DIFFERENTIAL/PLATELET - Abnormal; Notable for the following:    WBC 12.4 (*)    Lymphs Abs 4.1 (*)    Monocytes Absolute  1.1 (*)    All other components within normal limits  URINALYSIS, ROUTINE W REFLEX MICROSCOPIC - Abnormal; Notable for the following:    Color, Urine AMBER (*)    Hgb urine dipstick LARGE (*)    Bilirubin Urine SMALL (*)    Protein, ur 30 (*)    All other components within normal limits  URINALYSIS, MICROSCOPIC (REFLEX) - Abnormal; Notable for the following:    Bacteria, UA RARE (*)    Squamous Epithelial / LPF 0-5 (*)    All other components within normal limits  URINE CULTURE    EKG  EKG Interpretation None       Radiology Ct Renal Stone Study  Result Date: 05/12/2016 CLINICAL DATA:  Right flank pain radiating to groin EXAM: CT ABDOMEN AND  PELVIS WITHOUT CONTRAST TECHNIQUE: Multidetector CT imaging of the abdomen and pelvis was performed following the standard protocol without IV contrast. COMPARISON:  None. FINDINGS: Lower chest: Lung bases clear Hepatobiliary: Negative Pancreas: Negative Spleen: Negative Adrenals/Urinary Tract: Minimal right renal obstruction due to a tiny distal stone at the right UVJ measuring approximately 1 x 2 mm. No other renal calculi. Left kidney normal. Negative for mass lesion. Urinary bladder empty. Stomach/Bowel: Negative for bowel obstruction. No bowel mass or edema. Normal appendix. Vascular/Lymphatic: Negative Reproductive: Mild prostate enlargement Other: No free fluid.  Negative for hernia. Musculoskeletal: Negative IMPRESSION: 1 x 2 mm stone distal right ureter causing minimal obstruction. Otherwise negative. Electronically Signed   By: Marlan Palauharles  Clark M.D.   On: 05/12/2016 15:55    Procedures Procedures (including critical care time)  Medications Ordered in ED Medications  sodium chloride 0.9 % bolus 1,000 mL (0 mLs Intravenous Stopped 05/12/16 1629)  ketorolac (TORADOL) 15 MG/ML injection 15 mg (15 mg Intravenous Given 05/12/16 1526)  morphine 4 MG/ML injection 4 mg (4 mg Intravenous Given 05/12/16 1629)  ondansetron (ZOFRAN) injection 4 mg (4 mg Intravenous Given 05/12/16 1629)     Initial Impression / Assessment and Plan / ED Course  I have reviewed the triage vital signs and the nursing notes.  Pertinent labs & imaging results that were available during my care of the patient were reviewed by me and considered in my medical decision making (see chart for details).  Clinical Course   Patient is a well-appearing 35 year old male presenting with right flank pain radiating to the right groin. Similar symptoms last time he had a kidney stone. Patient's not been evaluated on a long time. We will do CT stone, urine, labs to evaluate for kidney function. We'll give fluids ketorolac.   CT shows  stone. No urinary tract infection. We'll treat with pain medication, Flomax, nausea medication. We'll have him follow-up with urologist as needed. Return precautions express.  Patient is comfortable, ambulatory, and taking PO at time of discharge.  Patient expressed understanding about return precautions.       Final Clinical Impressions(s) / ED Diagnoses   Final diagnoses:  Flank pain  Kidney stone    New Prescriptions Discharge Medication List as of 05/12/2016  5:53 PM    START taking these medications   Details  oxyCODONE-acetaminophen (PERCOCET/ROXICET) 5-325 MG tablet Take 1 tablet by mouth every 6 (six) hours as needed for severe pain., Starting Thu 05/12/2016, Print    tamsulosin (FLOMAX) 0.4 MG CAPS capsule Take 1 capsule (0.4 mg total) by mouth daily., Starting Thu 05/12/2016, Print         Seymour Pavlak Randall AnLyn Tevis Conger, MD 05/12/16 [id number]

## 2016-05-12 NOTE — ED Triage Notes (Signed)
C/o right flank pain around to groin. Onset today about 20 min ago. Hx of kidney stones in past and states feels same.

## 2016-05-12 NOTE — ED Notes (Signed)
ED Provider at bedside for discharge teaching and instructions.  Pt verbalized understanding of discharge instructions and denies any further questions at this time.

## 2016-05-14 ENCOUNTER — Emergency Department (HOSPITAL_BASED_OUTPATIENT_CLINIC_OR_DEPARTMENT_OTHER)
Admission: EM | Admit: 2016-05-14 | Discharge: 2016-05-14 | Disposition: A | Discharge status: Home / Self Care | Payer: Medicaid Other | Attending: Emergency Medicine | Admitting: Emergency Medicine

## 2016-05-14 ENCOUNTER — Encounter (HOSPITAL_BASED_OUTPATIENT_CLINIC_OR_DEPARTMENT_OTHER): Payer: Self-pay

## 2016-05-14 DIAGNOSIS — R451 Restlessness and agitation: Secondary | ICD-10-CM | POA: Insufficient documentation

## 2016-05-14 DIAGNOSIS — N201 Calculus of ureter: Secondary | ICD-10-CM | POA: Insufficient documentation

## 2016-05-14 DIAGNOSIS — F17210 Nicotine dependence, cigarettes, uncomplicated: Secondary | ICD-10-CM | POA: Insufficient documentation

## 2016-05-14 DIAGNOSIS — Z79899 Other long term (current) drug therapy: Secondary | ICD-10-CM | POA: Insufficient documentation

## 2016-05-14 LAB — URINE CULTURE: CULTURE: NO GROWTH

## 2016-05-14 MED ORDER — ONDANSETRON HCL 4 MG/2ML IJ SOLN
4.0000 mg | Freq: Once | INTRAMUSCULAR | Status: AC
  Administered 2016-05-14: 4 mg via INTRAVENOUS
  Filled 2016-05-14: qty 2

## 2016-05-14 MED ORDER — HYDROMORPHONE HCL 4 MG PO TABS: 2.0000 mg | ORAL_TABLET | ORAL | 0 refills | Status: AC | PRN

## 2016-05-14 MED ORDER — HYDROMORPHONE HCL 1 MG/ML IJ SOLN
1.0000 mg | Freq: Once | INTRAMUSCULAR | Status: AC
Start: 2016-05-14 — End: 2016-05-14
  Administered 2016-05-14: 1 mg via INTRAVENOUS
  Filled 2016-05-14: qty 1

## 2016-05-14 MED ORDER — HYDROMORPHONE HCL 1 MG/ML IJ SOLN
1.0000 mg | Freq: Once | INTRAMUSCULAR | Status: AC
  Administered 2016-05-14: 1 mg via INTRAVENOUS
  Filled 2016-05-14: qty 1

## 2016-05-14 MED ORDER — NAPROXEN 500 MG PO TABS: ORAL_TABLET | ORAL | 0 refills | Status: DC

## 2016-05-14 MED ORDER — KETOROLAC TROMETHAMINE 15 MG/ML IJ SOLN
15.0000 mg | Freq: Once | INTRAMUSCULAR | Status: AC
  Administered 2016-05-14: 15 mg via INTRAVENOUS
  Filled 2016-05-14: qty 1

## 2016-05-14 NOTE — ED Provider Notes (Signed)
[facility] Provider Note: Joshua DellJ. Lane Kemba Hoppes, MD, FACEP  CSN: [id number] MRN: [id number] ARRIVAL: 05/14/16 at 0523 ROOM: [facility]11/[facility]11   CHIEF COMPLAINT  Flank Pain   HISTORY OF PRESENT ILLNESS  Joshua Jackson is a 35 y.o. male who was seen 2 days ago for flank pain and diagnosed with a one by 2 millimeter distal right ureteral stone. He was discharged home on Percocet 5/325 (one Q6 hours prn), Zofran and Flomax. He last took Percocet about 11 PM yesterday evening and went to bed without significant discomfort. He awoke about an hour ago with severe right flank pain radiating to his right testicle. The pain is described as similar to previous kidney stone pain. He has had associated nausea and difficulty urinating. The pain is somewhat worse with movement or palpation.   Past Medical History:  Diagnosis Date  . Renal disorder   . Renal stones     History reviewed. No pertinent surgical history.  No family history on file.  Social History  Substance Use Topics  . Smoking status: Current Every Day Smoker    Packs/day: 1.00    Types: Cigarettes  . Smokeless tobacco: Never Used  . Alcohol use Yes    Prior to Admission medications   Medication Sig Start Date End Date Taking? Authorizing Provider  HYDROmorphone (DILAUDID) 4 MG tablet Take 0.5-1 tablets (2-4 mg total) by mouth every 4 (four) hours as needed for severe pain. 05/14/16   Joshua Nicholl, MD  naproxen (NAPROSYN) 500 MG tablet Take one tablet twice daily until stone passes. 05/14/16   Joshua Covell, MD  ondansetron (ZOFRAN) 4 MG tablet Take 1 tablet (4 mg total) by mouth every 8 (eight) hours as needed for nausea or vomiting. 05/12/16   Joshua Lyn Mackuen, MD  tamsulosin (FLOMAX) 0.4 MG CAPS capsule Take 1 capsule (0.4 mg total) by mouth daily. 05/12/16   Joshua Lyn Mackuen, MD    Allergies Patient has no known allergies.   REVIEW OF SYSTEMS  Negative except as noted here or in the History of Present  Illness.   PHYSICAL EXAMINATION  Initial Vital Signs Blood pressure 154/93, pulse 73, resp. rate 24, SpO2 100 %.  Examination General: Well-developed, well-nourished male in obvious discomfort; appearance consistent with age of record HENT: normocephalic; atraumatic Eyes: Normal appearance Neck: supple Heart: regular rate and rhythm Lungs: clear to auscultation bilaterally Abdomen: soft; nondistended; right lower quadrant tenderness; no masses or hepatosplenomegaly; bowel sounds present GU: Right CVA tenderness Extremities: No deformity; full range of motion; pulses normal Neurologic: Awake, alert and oriented; motor function intact in all extremities and symmetric; no facial droop Skin: Warm and dry Psychiatric: Mildly agitated   RESULTS  Summary of this visit's results, reviewed by myself:   EKG Interpretation  Date/Time:    Ventricular Rate:    PR Interval:    QRS Duration:   QT Interval:    QTC Calculation:   R Axis:     Text Interpretation:        Laboratory Studies: No results found for this or any previous visit (from the past 24 hour(s)). Imaging Studies: Ct Renal Stone Study  Result Date: 05/12/2016 CLINICAL DATA:  Right flank pain radiating to groin EXAM: CT ABDOMEN AND PELVIS WITHOUT CONTRAST TECHNIQUE: Multidetector CT imaging of the abdomen and pelvis was performed following the standard protocol without IV contrast. COMPARISON:  None. FINDINGS: Lower chest: Lung bases clear Hepatobiliary: Negative Pancreas: Negative Spleen: Negative Adrenals/Urinary Tract: Minimal right renal obstruction due to  a tiny distal stone at the right UVJ measuring approximately 1 x 2 mm. No other renal calculi. Left kidney normal. Negative for mass lesion. Urinary bladder empty. Stomach/Bowel: Negative for bowel obstruction. No bowel mass or edema. Normal appendix. Vascular/Lymphatic: Negative Reproductive: Mild prostate enlargement Other: No free fluid.  Negative for hernia.  Musculoskeletal: Negative IMPRESSION: 1 x 2 mm stone distal right ureter causing minimal obstruction. Otherwise negative. Electronically Signed   By: Marlan Palauharles  Clark M.D.   On: 05/12/2016 15:55    ED COURSE  Nursing notes and initial vitals signs, including pulse oximetry, reviewed.  Vitals:   05/14/16 0528 05/14/16 0535  BP: 154/93   Pulse: 73   Resp: 24   Temp:  98.1 F (36.7 C)  TempSrc:  Oral  SpO2: 100%    6:40 AM Pain significantly improved with IV medications. Patient still having some discomfort in the right testicle. The right testicle is itself nontender and without mass or hernia palpated. This likely represent referred pain from his ureteral colic. We will switch his analgesic from Percocet to Dilaudid.  PROCEDURES    ED DIAGNOSES     ICD-9-CM ICD-10-CM   1. Ureterolithiasis 592.1 N20.1        Paula LibraJohn Wilbert Schouten, MD 05/14/16 [phone]

## 2016-05-14 NOTE — ED Notes (Signed)
Pt verbalizes understanding of d/c instructions and denies any further needs at this time. 

## 2016-05-14 NOTE — ED Notes (Signed)
Pt also c/o testicular pain and feeling as if it is swollen, informed Dr. Read DriversMolpus

## 2016-05-14 NOTE — ED Triage Notes (Signed)
Pt dx'd with kidney stone 12/21, took pain medication last night at 1800 and fell asleep, woke with severe right flank pain again.

## 2016-07-28 ENCOUNTER — Emergency Department (HOSPITAL_BASED_OUTPATIENT_CLINIC_OR_DEPARTMENT_OTHER): Payer: Self-pay

## 2016-07-28 ENCOUNTER — Encounter (HOSPITAL_BASED_OUTPATIENT_CLINIC_OR_DEPARTMENT_OTHER): Payer: Self-pay | Admitting: *Deleted

## 2016-07-28 ENCOUNTER — Emergency Department (HOSPITAL_BASED_OUTPATIENT_CLINIC_OR_DEPARTMENT_OTHER)
Admission: EM | Admit: 2016-07-28 | Discharge: 2016-07-28 | Disposition: A | Discharge status: Home / Self Care | Payer: Self-pay | Attending: Emergency Medicine | Admitting: Emergency Medicine

## 2016-07-28 DIAGNOSIS — Z716 Tobacco abuse counseling: Secondary | ICD-10-CM | POA: Insufficient documentation

## 2016-07-28 DIAGNOSIS — R911 Solitary pulmonary nodule: Secondary | ICD-10-CM

## 2016-07-28 DIAGNOSIS — B9789 Other viral agents as the cause of diseases classified elsewhere: Secondary | ICD-10-CM

## 2016-07-28 DIAGNOSIS — F17210 Nicotine dependence, cigarettes, uncomplicated: Secondary | ICD-10-CM | POA: Insufficient documentation

## 2016-07-28 DIAGNOSIS — Z79899 Other long term (current) drug therapy: Secondary | ICD-10-CM | POA: Insufficient documentation

## 2016-07-28 DIAGNOSIS — J069 Acute upper respiratory infection, unspecified: Secondary | ICD-10-CM | POA: Insufficient documentation

## 2016-07-28 MED ORDER — BENZONATATE 100 MG PO CAPS: 100.0000 mg | ORAL_CAPSULE | Freq: Three times a day (TID) | ORAL | 0 refills | Status: DC

## 2016-07-28 NOTE — ED Triage Notes (Signed)
Pt reports cough, sore throat, congestion, sinus pain x 3 days ago. Denies any other c/o.

## 2016-07-28 NOTE — Discharge Instructions (Signed)
Your chest x-ray does not show pneumonia. There is a small lung nodule in the lung. This usually does not cause any problems but you will need to see a primary care doctor for monitoring down the road to make sure it does not change.  Continue to get plenty of rest and keep well-hydrated for your viral infection.  Return without fail for worsening symptoms, including difficulty breathing, passing out, confusion, or any other symptoms concerning to you

## 2016-07-28 NOTE — ED Provider Notes (Signed)
[facility]   CSN: [id number] Arrival date & time: 07/28/16  4098     History   Chief Complaint Chief Complaint  Patient presents with  . Cough    HPI Joshua Jackson is a 36 y.o. male.  The history is provided by the patient.  Cough  This is a new problem. The current episode started more than 2 days ago. The problem occurs constantly. The problem has been gradually worsening. The cough is productive of sputum. There has been no fever. Associated symptoms include chest pain (chest wall soreness), chills, sweats, headaches, rhinorrhea, sore throat, myalgias and shortness of breath. Pertinent negatives include no eye redness. He has tried cough syrup for the symptoms. The treatment provided no relief. He is a smoker. His past medical history does not include COPD or asthma.     Past Medical History:  Diagnosis Date  . Renal disorder   . Renal stones     There are no active problems to display for this patient.   History reviewed. No pertinent surgical history.     Home Medications    Prior to Admission medications   Medication Sig Start Date End Date Taking? Authorizing Provider  benzonatate (TESSALON) 100 MG capsule Take 1 capsule (100 mg total) by mouth every 8 (eight) hours. 07/28/16   Lavera Guise, MD  HYDROmorphone (DILAUDID) 4 MG tablet Take 0.5-1 tablets (2-4 mg total) by mouth every 4 (four) hours as needed for severe pain. 05/14/16   John Molpus, MD  naproxen (NAPROSYN) 500 MG tablet Take one tablet twice daily until stone passes. 05/14/16   John Molpus, MD  ondansetron (ZOFRAN) 4 MG tablet Take 1 tablet (4 mg total) by mouth every 8 (eight) hours as needed for nausea or vomiting. 05/12/16   Courteney Lyn Mackuen, MD  tamsulosin (FLOMAX) 0.4 MG CAPS capsule Take 1 capsule (0.4 mg total) by mouth daily. 05/12/16   Courteney Lyn Mackuen, MD    Family History History reviewed. No pertinent family history.  Social History Social History    Substance Use Topics  . Smoking status: Current Every Day Smoker    Packs/day: 1.00    Types: Cigarettes  . Smokeless tobacco: Never Used  . Alcohol use Yes     Allergies   Patient has no known allergies.   Review of Systems Review of Systems  Constitutional: Positive for chills.  HENT: Positive for rhinorrhea and sore throat.   Eyes: Negative for redness.  Respiratory: Positive for cough and shortness of breath.   Cardiovascular: Positive for chest pain (chest wall soreness).  Genitourinary: Negative for difficulty urinating.  Musculoskeletal: Positive for myalgias.  Allergic/Immunologic: Negative for immunocompromised state.  Neurological: Positive for headaches.  Hematological: Does not bruise/bleed easily.  Psychiatric/Behavioral: Negative for confusion.     Physical Exam Updated Vital Signs BP 137/88 (BP Location: Left Arm)   Pulse 71   Temp 97.7 F (36.5 C) (Oral)   Resp 18   Ht 6' (1.829 m)   Wt 190 lb (86.2 kg)   SpO2 100%   BMI 25.77 kg/m   Physical Exam Physical Exam  Nursing note and vitals reviewed. Constitutional: Well developed, well nourished, non-toxic, and in no acute distress Head: Normocephalic and atraumatic.  Mouth/Throat: Oropharynx is clear and moist.  Neck: Normal range of motion. Neck supple.  Ears: normal bilateral TMs Cardiovascular: Normal rate and regular rhythm.   Pulmonary/Chest: Effort normal and breath sounds normal.  Abdominal: Soft. There is no tenderness.  There is no rebound and no guarding.  Musculoskeletal: Normal range of motion.  Neurological: Alert, no facial droop, fluent speech, moves all extremities symmetrically Skin: Skin is warm and dry.  Psychiatric: Cooperative   ED Treatments / Results  Labs (all labs ordered are listed, but only abnormal results are displayed) Labs Reviewed - No data to display  EKG  EKG Interpretation None       Radiology Dg Chest 2 View  Result Date: 07/28/2016 CLINICAL  DATA:  Cough, congestion EXAM: CHEST  2 VIEW COMPARISON:  None. FINDINGS: Ovoid nodular density projects over the right apex, likely calcified granuloma given the density. Heart is normal size. Lungs otherwise clear. No effusions or acute bony abnormality. IMPRESSION: Probable calcified granuloma over the right apex. No acute cardiopulmonary disease. Electronically Signed   By: Charlett NoseKevin  Dover M.D.   On: 07/28/2016 08:41    Procedures Procedures (including critical care time)  Medications Ordered in ED Medications - No data to display   Initial Impression / Assessment and Plan / ED Course  I have reviewed the triage vital signs and the nursing notes.  Pertinent labs & imaging results that were available during my care of the patient were reviewed by me and considered in my medical decision making (see chart for details).     Presenting with 3 days of cough, congestion, sore throat, runny nose. Presentation consistent with that of viral respiratory illness. He is well-appearing, with normal vital signs. CXR visualized. No infiltrate, edema or other acute cardiopulmonary processes. There is small lung nodule/granuloma which patient is notified about. Discussed outpatient follow-up for this to make sure it does not change. Discussed supportive care management for his illness.  Discussed smoking cessation, and resources offered.  Strict return and follow-up instructions reviewed. He expressed understanding of all discharge instructions and felt comfortable with the plan of care.   Final Clinical Impressions(s) / ED Diagnoses   Final diagnoses:  Viral URI with cough  Encounter for smoking cessation counseling  Lung nodule    New Prescriptions New Prescriptions   BENZONATATE (TESSALON) 100 MG CAPSULE    Take 1 capsule (100 mg total) by mouth every 8 (eight) hours.     Lavera Guiseana Duo Alexis Mizuno, MD 07/28/16 [phone]

## 2017-04-24 ENCOUNTER — Emergency Department (HOSPITAL_BASED_OUTPATIENT_CLINIC_OR_DEPARTMENT_OTHER)
Admission: EM | Admit: 2017-04-24 | Discharge: 2017-04-24 | Disposition: A | Discharge status: Home / Self Care | Payer: Self-pay | Attending: Emergency Medicine | Admitting: Emergency Medicine

## 2017-04-24 ENCOUNTER — Emergency Department (HOSPITAL_BASED_OUTPATIENT_CLINIC_OR_DEPARTMENT_OTHER): Payer: Self-pay

## 2017-04-24 ENCOUNTER — Encounter (HOSPITAL_BASED_OUTPATIENT_CLINIC_OR_DEPARTMENT_OTHER): Payer: Self-pay | Admitting: *Deleted

## 2017-04-24 ENCOUNTER — Other Ambulatory Visit: Payer: Self-pay

## 2017-04-24 DIAGNOSIS — R509 Fever, unspecified: Secondary | ICD-10-CM | POA: Insufficient documentation

## 2017-04-24 DIAGNOSIS — R51 Headache: Secondary | ICD-10-CM | POA: Insufficient documentation

## 2017-04-24 DIAGNOSIS — R05 Cough: Secondary | ICD-10-CM | POA: Insufficient documentation

## 2017-04-24 DIAGNOSIS — B9789 Other viral agents as the cause of diseases classified elsewhere: Secondary | ICD-10-CM | POA: Insufficient documentation

## 2017-04-24 DIAGNOSIS — F17210 Nicotine dependence, cigarettes, uncomplicated: Secondary | ICD-10-CM | POA: Insufficient documentation

## 2017-04-24 DIAGNOSIS — J069 Acute upper respiratory infection, unspecified: Secondary | ICD-10-CM | POA: Insufficient documentation

## 2017-04-24 DIAGNOSIS — R5381 Other malaise: Secondary | ICD-10-CM | POA: Insufficient documentation

## 2017-04-24 DIAGNOSIS — J3489 Other specified disorders of nose and nasal sinuses: Secondary | ICD-10-CM | POA: Insufficient documentation

## 2017-04-24 LAB — RAPID STREP SCREEN (MED CTR MEBANE ONLY): STREPTOCOCCUS, GROUP A SCREEN (DIRECT): NEGATIVE

## 2017-04-24 MED ORDER — FLUTICASONE PROPIONATE 50 MCG/ACT NA SUSP: 1.0000 | Freq: Every day | NASAL | 0 refills | Status: AC

## 2017-04-24 MED ORDER — ACETAMINOPHEN 500 MG PO TABS
1000.0000 mg | ORAL_TABLET | Freq: Once | ORAL | Status: AC
  Administered 2017-04-24: 1000 mg via ORAL
  Filled 2017-04-24: qty 2

## 2017-04-24 MED ORDER — BENZONATATE 100 MG PO CAPS: 100.0000 mg | ORAL_CAPSULE | Freq: Three times a day (TID) | ORAL | 0 refills | Status: AC

## 2017-04-24 MED FILL — FLUTICASONE PROP 50 MCG SPR: 50 | 60 days supply | Qty: 16 | Fill #0

## 2017-04-24 MED FILL — BENZONATATE 100 MG CAPSULE: 100 | 7 days supply | Qty: 21 | Fill #0

## 2017-04-24 NOTE — ED Provider Notes (Signed)
[facility] EMERGENCY DEPARTMENT Provider Note   CSN: [id number] Arrival date & time: 04/24/17  1059     History   Chief Complaint Chief Complaint  Patient presents with  . Cough    HPI Joshua Jackson is a 36 y.o. male who presents with nasal congestion, rhinorrhea, cough, generalized malaise for the last 4 days.  Patient reports the cough is productive of clear phlegm.  He states that he had a fever over the weekend that went up to 100.  He had one episode of 101 but states that has improved.  He has not taken any medications for the fever.  He reports that the only medication he has been taking his Mucinex which has provided minimal relief.  Patient also reports a generalized headache.  Patient denies any chest pain, difficulty breathing, abdominal pain, nausea/vomiting.  He does not have a history of asthma received PD.  He reports that he smokes approximately half pack cigarettes a day.  The history is provided by the patient.    Past Medical History:  Diagnosis Date  . Renal disorder   . Renal stones     There are no active problems to display for this patient.   History reviewed. No pertinent surgical history.     Home Medications    Prior to Admission medications   Medication Sig Start Date End Date Taking? Authorizing Provider  benzonatate (TESSALON) 100 MG capsule Take 1 capsule (100 mg total) by mouth every 8 (eight) hours. 04/24/17   Maxwell Caul, PA-C  fluticasone (FLONASE) 50 MCG/ACT nasal spray Place 1 spray into both nostrils daily. 04/24/17   Maxwell Caul, PA-C  HYDROmorphone (DILAUDID) 4 MG tablet Take 0.5-1 tablets (2-4 mg total) by mouth every 4 (four) hours as needed for severe pain. 05/14/16   Molpus, John, MD  naproxen (NAPROSYN) 500 MG tablet Take one tablet twice daily until stone passes. 05/14/16   Molpus, Jonny Ruiz, MD  ondansetron (ZOFRAN) 4 MG tablet Take 1 tablet (4 mg total) by mouth every 8 (eight) hours as needed for nausea or  vomiting. 05/12/16   Mackuen, Courteney Lyn, MD  tamsulosin (FLOMAX) 0.4 MG CAPS capsule Take 1 capsule (0.4 mg total) by mouth daily. 05/12/16   Mackuen, Cindee Salt, MD    Family History No family history on file.  Social History Social History   Tobacco Use  . Smoking status: Current Every Day Smoker    Packs/day: 1.00    Types: Cigarettes  . Smokeless tobacco: Never Used  Substance Use Topics  . Alcohol use: Yes  . Drug use: Not on file     Allergies   Patient has no known allergies.   Review of Systems Review of Systems  Constitutional: Positive for fever.  HENT: Positive for congestion, rhinorrhea and sore throat.   Respiratory: Positive for cough. Negative for shortness of breath.   Cardiovascular: Negative for chest pain.  Gastrointestinal: Negative for abdominal pain, nausea and vomiting.  Neurological: Negative for headaches.     Physical Exam Updated Vital Signs BP 135/79 (BP Location: Right Arm)   Pulse 86   Temp 98.3 F (36.8 C) (Oral)   Resp 16   Ht 5\' 11"  (1.803 m)   Wt 81.6 kg (180 lb)   SpO2 99%   BMI 25.10 kg/m   Physical Exam  Constitutional: He is oriented to person, place, and time. He appears well-developed and well-nourished.  HENT:  Head: Normocephalic and atraumatic.  Nose: Mucosal edema and  rhinorrhea present. Right sinus exhibits maxillary sinus tenderness. Left sinus exhibits maxillary sinus tenderness.  Mouth/Throat: Uvula is midline and mucous membranes are normal. No trismus in the jaw. Posterior oropharyngeal erythema present.  Posterior oropharynx is slightly erythematous.  No edema, no exudates.  Uvula is midline.  No trismus.  No evidence of peritonsillar abscess.  Eyes: Conjunctivae, EOM and lids are normal. Pupils are equal, round, and reactive to light.  Neck: Full passive range of motion without pain.  Cardiovascular: Normal rate, regular rhythm, normal heart sounds and normal pulses. Exam reveals no gallop and no  friction rub.  No murmur heard. Pulmonary/Chest: Effort normal and breath sounds normal. He has no rales.  No evidence of respiratory distress. Able to speak in full sentences without difficulty.  Abdominal: Soft. Normal appearance. There is no tenderness. There is no rigidity and no guarding.  Musculoskeletal: Normal range of motion.  Neurological: He is alert and oriented to person, place, and time.  Skin: Skin is warm and dry. Capillary refill takes less than 2 seconds.  Psychiatric: He has a normal mood and affect. His speech is normal.  Nursing note and vitals reviewed.    ED Treatments / Results  Labs (all labs ordered are listed, but only abnormal results are displayed) Labs Reviewed  RAPID STREP SCREEN (NOT [facility])  CULTURE, GROUP A STREP [facility])    EKG  EKG Interpretation None       Radiology Dg Chest 2 View  Result Date: 04/24/2017 CLINICAL DATA:  Cough and sore throat EXAM: CHEST  2 VIEW COMPARISON:  July 28, 2016. FINDINGS: There is no edema or consolidation. The heart size and pulmonary vascularity are normal. No adenopathy. No evident bone lesions. IMPRESSION: No edema or consolidation. Electronically Signed   By: Bretta BangWilliam  Woodruff III M.D.   On: 04/24/2017 11:50    Procedures Procedures (including critical care time)  Medications Ordered in ED Medications  acetaminophen (TYLENOL) tablet 1,000 mg (1,000 mg Oral Given 04/24/17 1148)     Initial Impression / Assessment and Plan / ED Course  I have reviewed the triage vital signs and the nursing notes.  Pertinent labs & imaging results that were available during my care of the patient were reviewed by me and considered in my medical decision making (see chart for details).     36 year old male who presents for evaluation of 3 days of nasal congestion, cough, rhinorrhea, sore throat.  Also reports generalized malaise.  Reports had a fever initially but states that has resolved. Patient is afebrile, non-toxic  appearing, sitting comfortably on examination table. Vital signs reviewed and stable.  No evidence of respiratory distress on exam.  O2 sats are 100% on room air.  No wheezing noted on exam.  Consider bronchitis versus viral URI versus pneumonia versus pharyngitis.  History/physical exam not concerning for Ludwig angina or peritonsillar abscess.  Plan to check chest x-ray for evaluation of acute infectious etiology.  Will plan to do rapid strep for evaluation of pharyngitis.  Rapid strep negative.  Chest x-ray with no acute abnormalities.  Discussed results with patient.  We will plan to treat as bronchitis/URI.  Patient instructed on conservative at home therapies. Patient had ample opportunity for questions and discussion. All patient's questions were answered with full understanding. Strict return precautions discussed. Patient expresses understanding and agreement to plan.    Final Clinical Impressions(s) / ED Diagnoses   Final diagnoses:  Viral URI with cough    ED Discharge Orders  Ordered    fluticasone (FLONASE) 50 MCG/ACT nasal spray  Daily     04/24/17 1217    benzonatate (TESSALON) 100 MG capsule  Every 8 hours     04/24/17 1217       Maxwell CaulLayden, Daeja Helderman A, PA-C 04/24/17 1750    Lavera GuiseLiu, Dana Duo, MD 04/27/17 2204

## 2017-04-24 NOTE — Discharge Instructions (Signed)
You can take Tylenol or Ibuprofen as directed for pain. You can alternate Tylenol and Ibuprofen every 4 hours. If you take Tylenol at 1pm, then you can take Ibuprofen at 5pm. Then you can take Tylenol again at 9pm.   Use fluticasone as needed for Nasal congestion.  Use Tessalon Perles as directed for cough.  He can also use over-the-counter Delsym which will help with the cough.  Follow-up with your primary care doctor in the next 24-48 hours for further evaluation.  Return to the emergency department for any fever, difficulty breathing, vomiting, difficulty eating, any other worsening or concerning symptoms.

## 2017-04-24 NOTE — ED Triage Notes (Signed)
Cough, headache, chest soreness, bodyaches, sore throat x 4 days.

## 2017-04-26 LAB — CULTURE, GROUP A STREP (THRC)

## 2018-01-28 IMAGING — CR DG CHEST 2V
2 series · 2 of 2 positions shown · non-contrast
Comparison: July 28, 2016.

CLINICAL DATA: Cough and sore throat

EXAM:
CHEST  2 VIEW

[w chest pa]
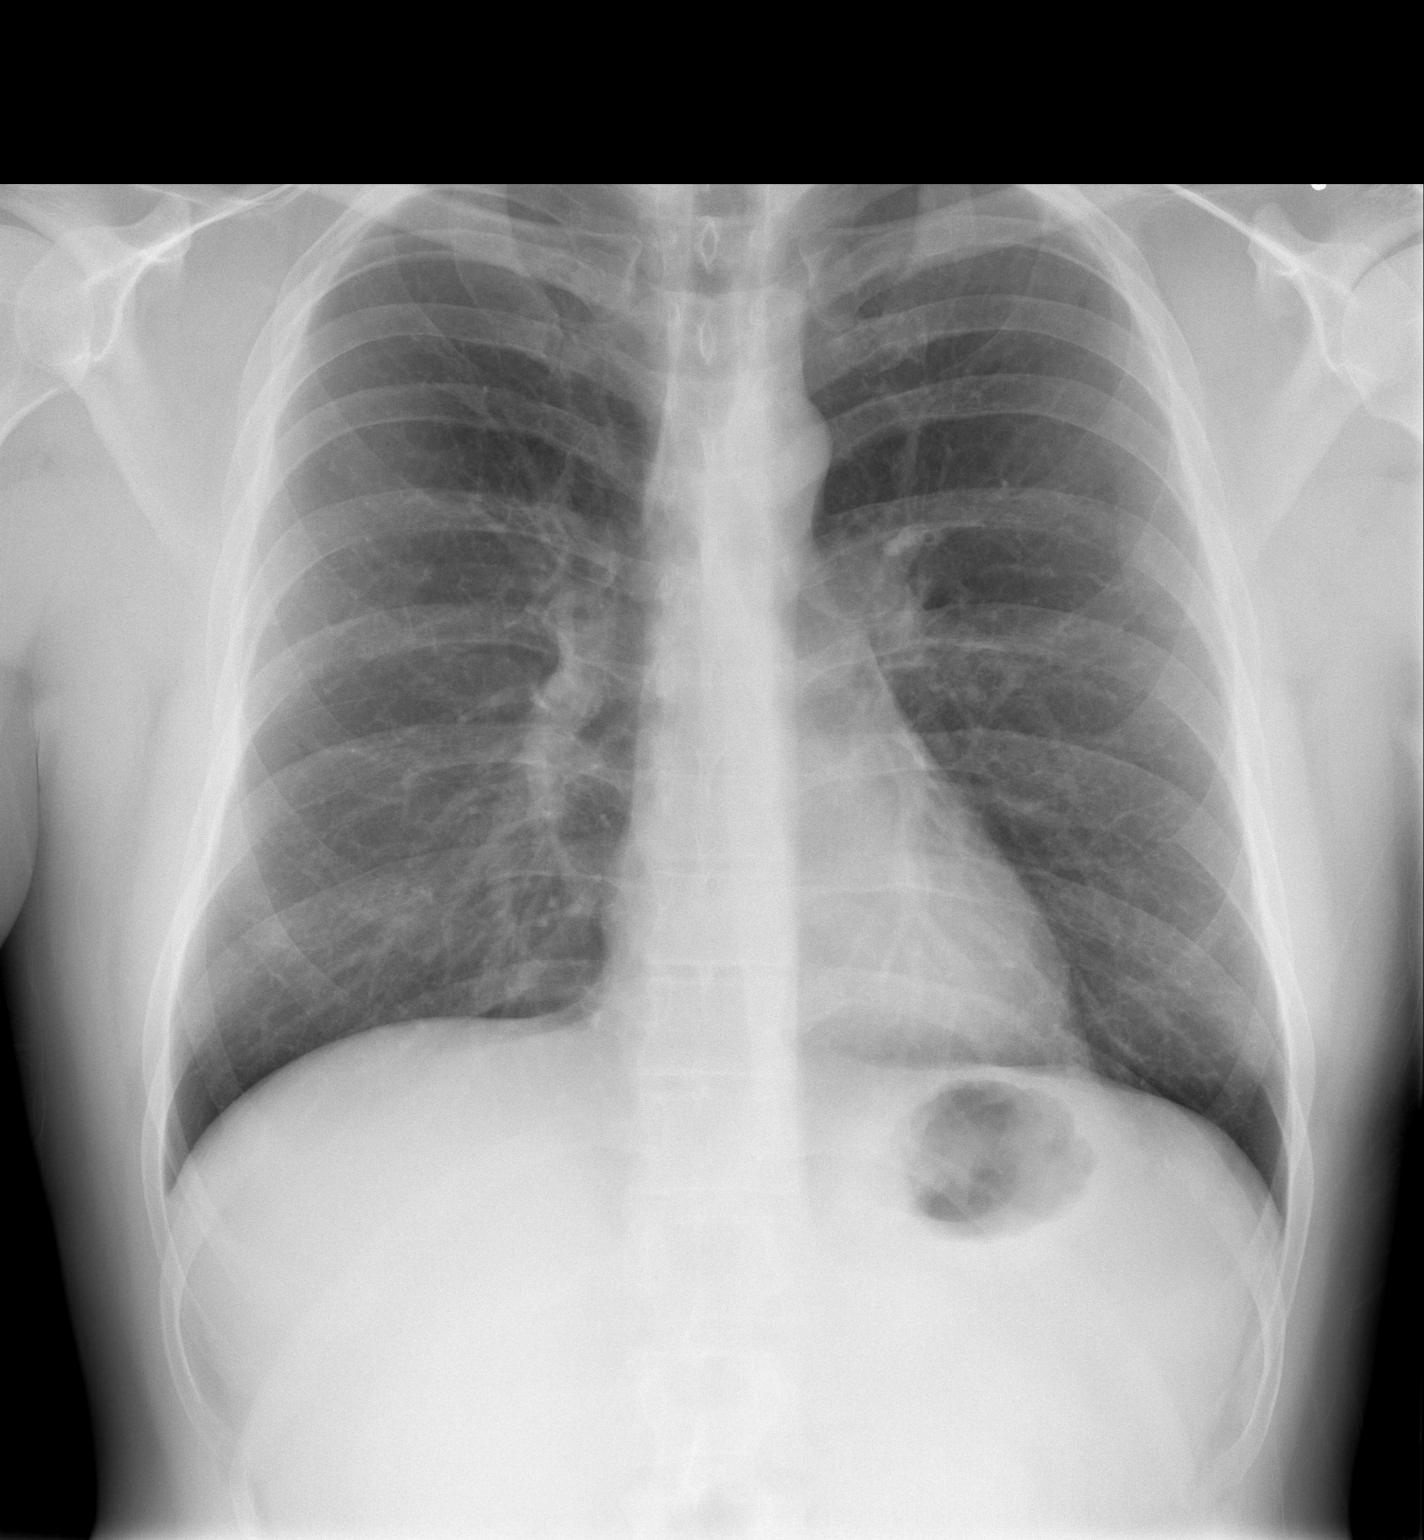

[w chest lat]
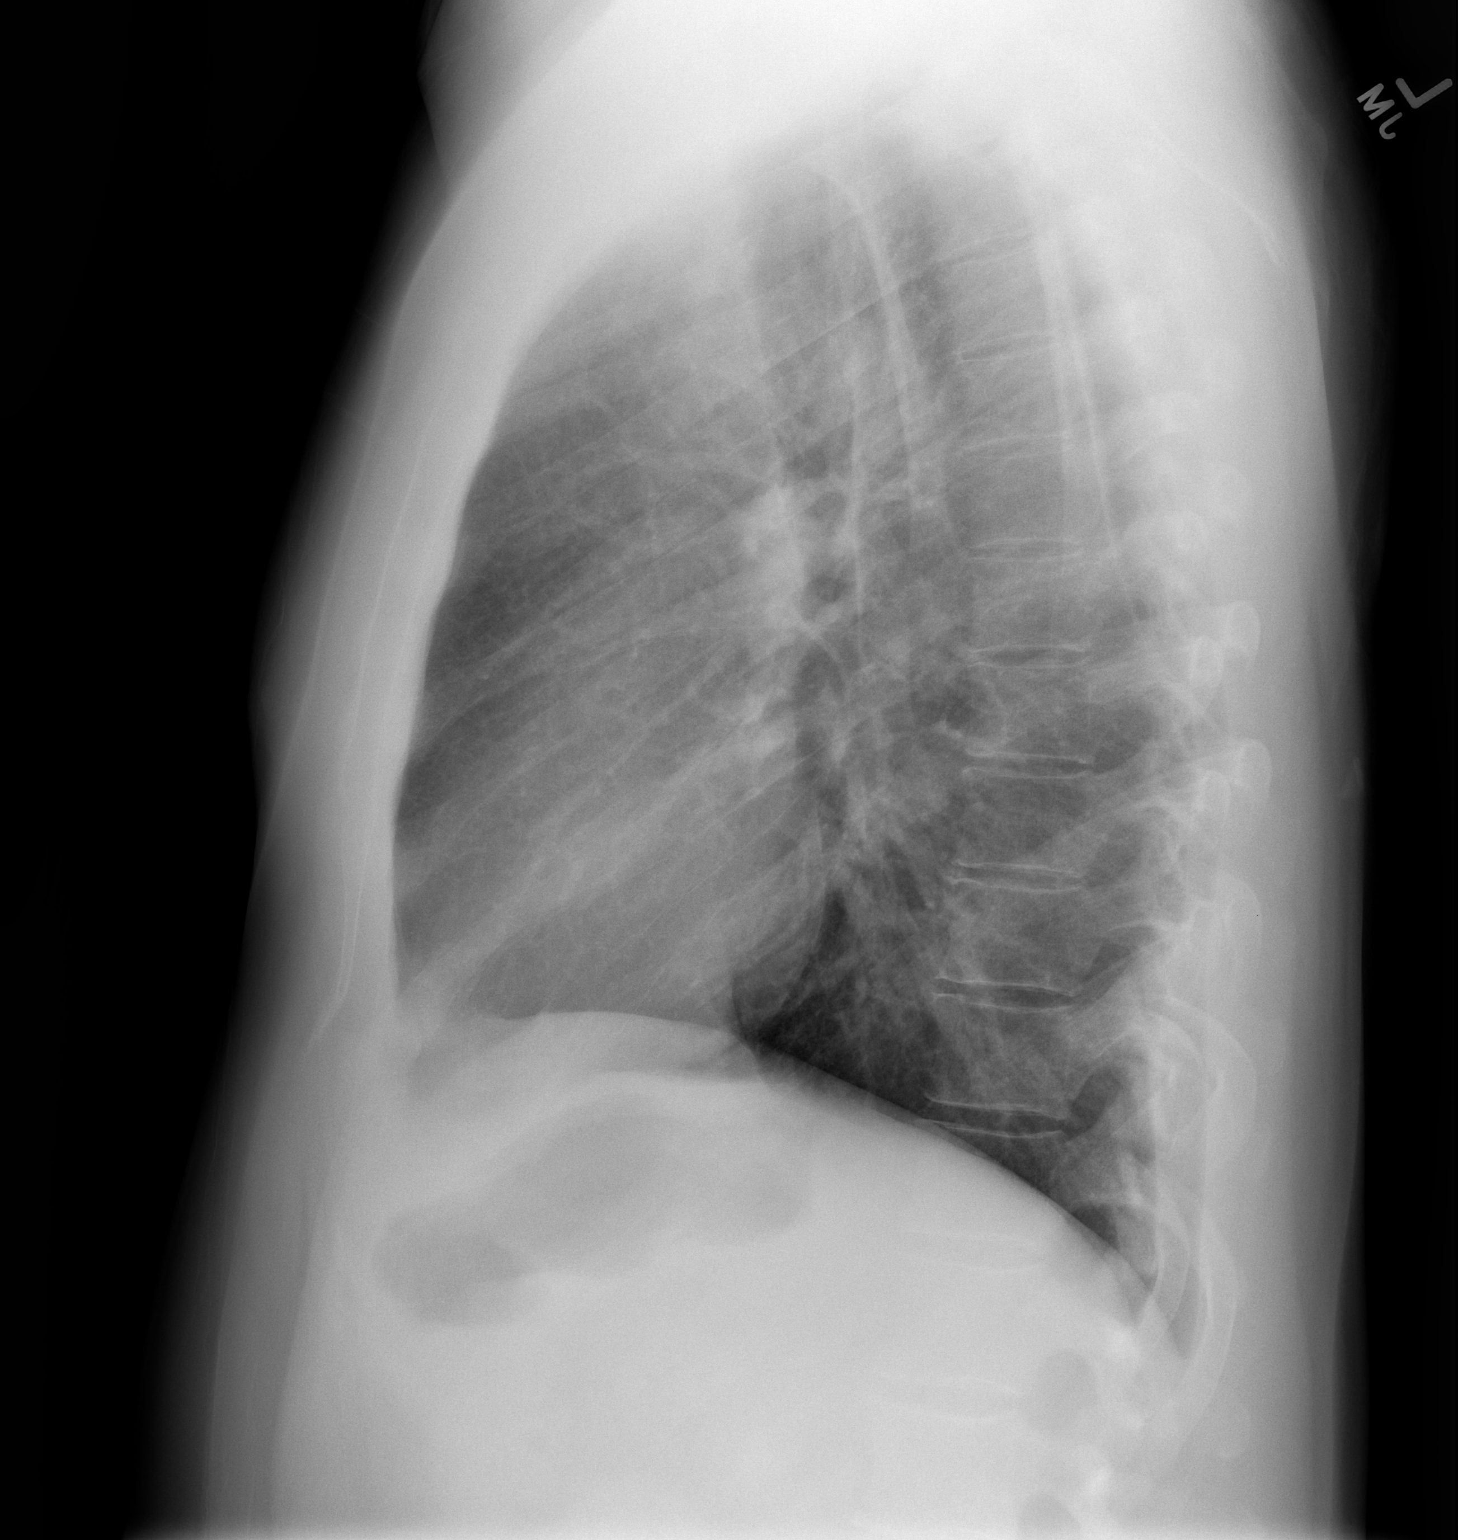

[2 of 2 positions shown; findings below may reference images not displayed]

FINDINGS: There is no edema or consolidation. The heart size and pulmonary
vascularity are normal. No adenopathy. No evident bone lesions.
IMPRESSION: No edema or consolidation.

## 2019-09-12 ENCOUNTER — Emergency Department (HOSPITAL_BASED_OUTPATIENT_CLINIC_OR_DEPARTMENT_OTHER)
Admission: EM | Admit: 2019-09-12 | Discharge: 2019-09-12 | Disposition: A | Discharge status: Home / Self Care | Payer: Self-pay | Attending: Emergency Medicine | Admitting: Emergency Medicine

## 2019-09-12 ENCOUNTER — Other Ambulatory Visit: Payer: Self-pay

## 2019-09-12 ENCOUNTER — Emergency Department (HOSPITAL_BASED_OUTPATIENT_CLINIC_OR_DEPARTMENT_OTHER): Payer: Self-pay

## 2019-09-12 ENCOUNTER — Encounter (HOSPITAL_BASED_OUTPATIENT_CLINIC_OR_DEPARTMENT_OTHER): Payer: Self-pay | Admitting: Emergency Medicine

## 2019-09-12 DIAGNOSIS — F17210 Nicotine dependence, cigarettes, uncomplicated: Secondary | ICD-10-CM | POA: Insufficient documentation

## 2019-09-12 DIAGNOSIS — Z79899 Other long term (current) drug therapy: Secondary | ICD-10-CM | POA: Insufficient documentation

## 2019-09-12 DIAGNOSIS — X501XXA Overexertion from prolonged static or awkward postures, initial encounter: Secondary | ICD-10-CM | POA: Insufficient documentation

## 2019-09-12 DIAGNOSIS — M79672 Pain in left foot: Secondary | ICD-10-CM | POA: Insufficient documentation

## 2019-09-12 MED ORDER — NAPROXEN 500 MG PO TABS: 500.0000 mg | ORAL_TABLET | Freq: Two times a day (BID) | ORAL | 0 refills | Status: AC

## 2019-09-12 NOTE — Discharge Instructions (Signed)
Begin taking naproxen as prescribed.  Rest.  Follow-up with primary doctor if not improving in the next week.

## 2019-09-12 NOTE — ED Triage Notes (Signed)
Pain to top of L foot x 3 weeks. States he has been walking a lot at work.

## 2019-09-12 NOTE — ED Provider Notes (Signed)
[facility] Provider Note   CSN: [id number] Arrival date & time: 09/12/19  1341     History Chief Complaint  Patient presents with  . Foot Pain    Joshua Jackson is a 39 y.o. male.  Patient is a 39 year old male with history of kidney stones.  He presents today for evaluation of left foot pain.  This began several weeks ago and is gradually worsening.  Patient denies any specific injury or trauma.  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  Pain is worse with ambulation and relieved somewhat with rest.  The history is provided by the patient.  Foot Pain This is a new problem. The problem occurs constantly. The problem has been gradually worsening. The symptoms are aggravated by walking. The symptoms are relieved by rest. He has tried nothing for the symptoms.       Past Medical History:  Diagnosis Date  . Renal disorder   . Renal stones     There are no problems to display for this patient.   History reviewed. No pertinent surgical history.     No family history on file.  Social History   Tobacco Use  . Smoking status: Current Every Day Smoker    Packs/day: 1.00    Types: Cigarettes  . Smokeless tobacco: Never Used  Substance Use Topics  . Alcohol use: Yes  . Drug use: Not on file    Home Medications Prior to Admission medications   Medication Sig Start Date End Date Taking? Authorizing Provider  benzonatate (TESSALON) 100 MG capsule Take 1 capsule (100 mg total) by mouth every 8 (eight) hours. 04/24/17   Volanda Napoleon, PA-C  fluticasone (FLONASE) 50 MCG/ACT nasal spray Place 1 spray into both nostrils daily. 04/24/17   Volanda Napoleon, PA-C  HYDROmorphone (DILAUDID) 4 MG tablet Take 0.5-1 tablets (2-4 mg total) by mouth every 4 (four) hours as needed for severe pain. 05/14/16   Molpus, John, MD  naproxen (NAPROSYN) 500 MG tablet Take one tablet twice daily until stone passes. 05/14/16    Molpus, Jenny Reichmann, MD  ondansetron (ZOFRAN) 4 MG tablet Take 1 tablet (4 mg total) by mouth every 8 (eight) hours as needed for nausea or vomiting. 05/12/16   Mackuen, Courteney Lyn, MD  tamsulosin (FLOMAX) 0.4 MG CAPS capsule Take 1 capsule (0.4 mg total) by mouth daily. 05/12/16   Mackuen, Fredia Sorrow, MD    Allergies    Patient has no known allergies.  Review of Systems   Review of Systems  All other systems reviewed and are negative.   Physical Exam Updated Vital Signs BP (!) 146/87 (BP Location: Right Arm)   Pulse (!) 54   Temp 98 F (36.7 C) (Oral)   Resp 18   Ht 5\' 11"  (1.803 m)   Wt 96.2 kg   SpO2 98%   BMI 29.57 kg/m   Physical Exam Vitals and nursing note reviewed.  Constitutional:      General: He is not in acute distress.    Appearance: Normal appearance. He is not ill-appearing.  HENT:     Head: Normocephalic and atraumatic.  Pulmonary:     Effort: Pulmonary effort is normal.  Musculoskeletal:     Comments: The left foot appears grossly normal.  There is no significant swelling.  There is tenderness to palpation across the dorsal aspect of the proximal foot.  DP pulses are easily palpable  and motor and sensation are intact throughout the foot.  Skin:    General: Skin is warm and dry.  Neurological:     Mental Status: He is alert.     ED Results / Procedures / Treatments   Labs (all labs ordered are listed, but only abnormal results are displayed) Labs Reviewed - No data to display  EKG None  Radiology No results found.  Procedures Procedures (including critical care time)  Medications Ordered in ED Medications - No data to display  ED Course  I have reviewed the triage vital signs and the nursing notes.  Pertinent labs & imaging results that were available during my care of the patient were reviewed by me and considered in my medical decision making (see chart for details).    MDM Rules/Calculators/A&P  Patient presenting here with  complaints of foot pain.  He denies any specific injury, but does walk constantly for his job.  X-rays are negative for fracture and the foot appears otherwise unremarkable.  Patient to be discharged with rest, NSAIDs, and as needed return/follow-up.  Final Clinical Impression(s) / ED Diagnoses Final diagnoses:  None    Rx / DC Orders ED Discharge Orders    None       Geoffery Lyons, MD 09/12/19 [phone]

## 2020-06-17 IMAGING — DX DG FOOT COMPLETE 3+V*L*
3 series · 3 of 3 positions shown · non-contrast
Comparison: None.

CLINICAL DATA: Pain across the top of the left foot for 3 weeks.

EXAM:
LEFT FOOT - COMPLETE 3+ VIEW

[foot ap]
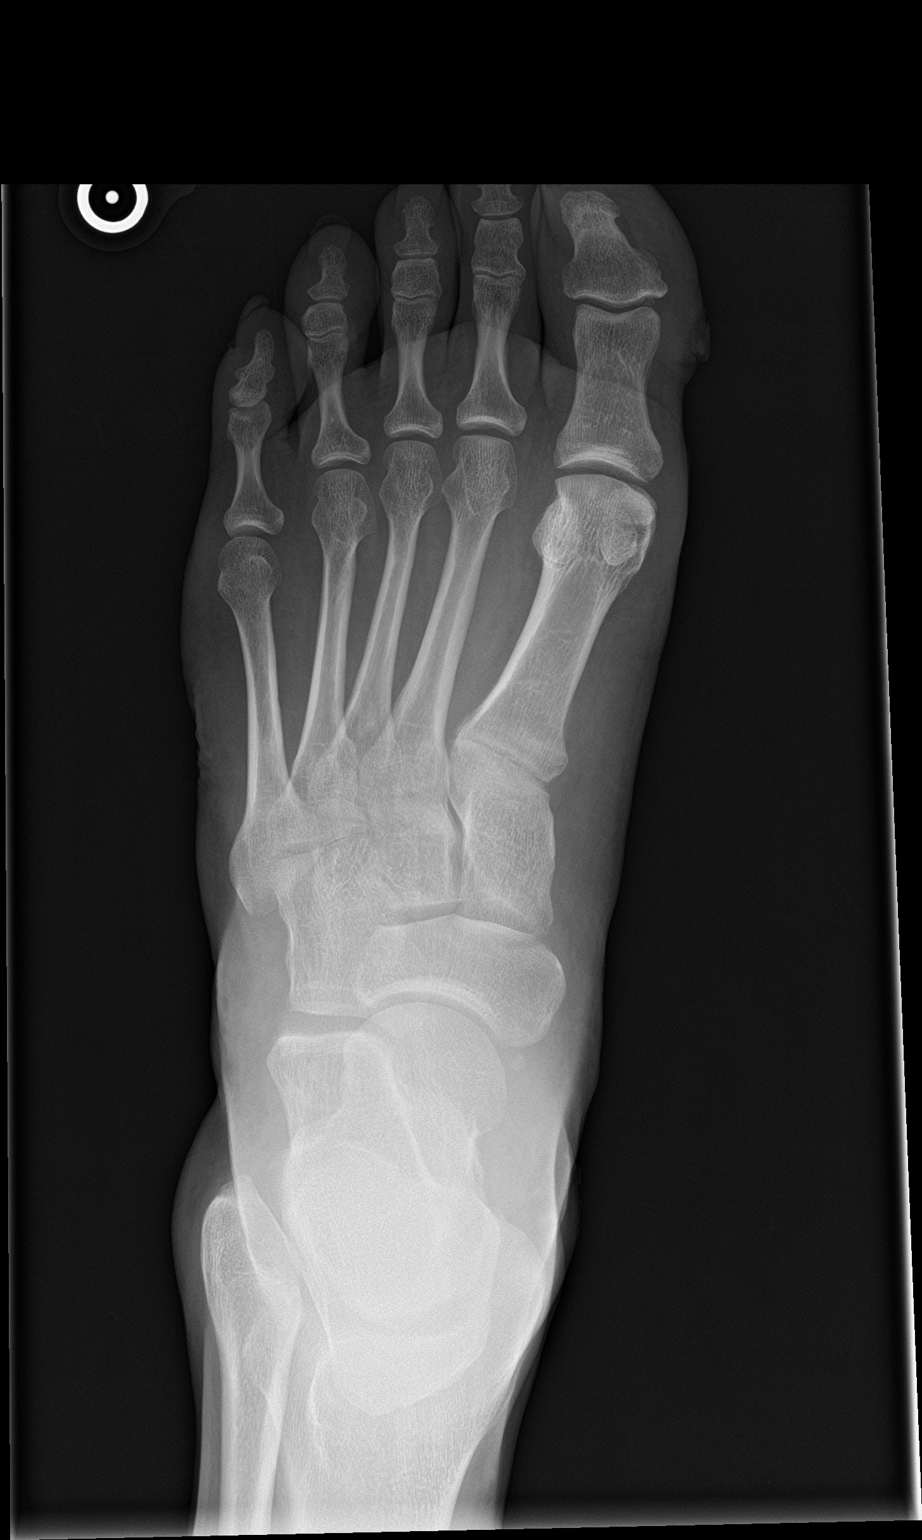

[foot obl]
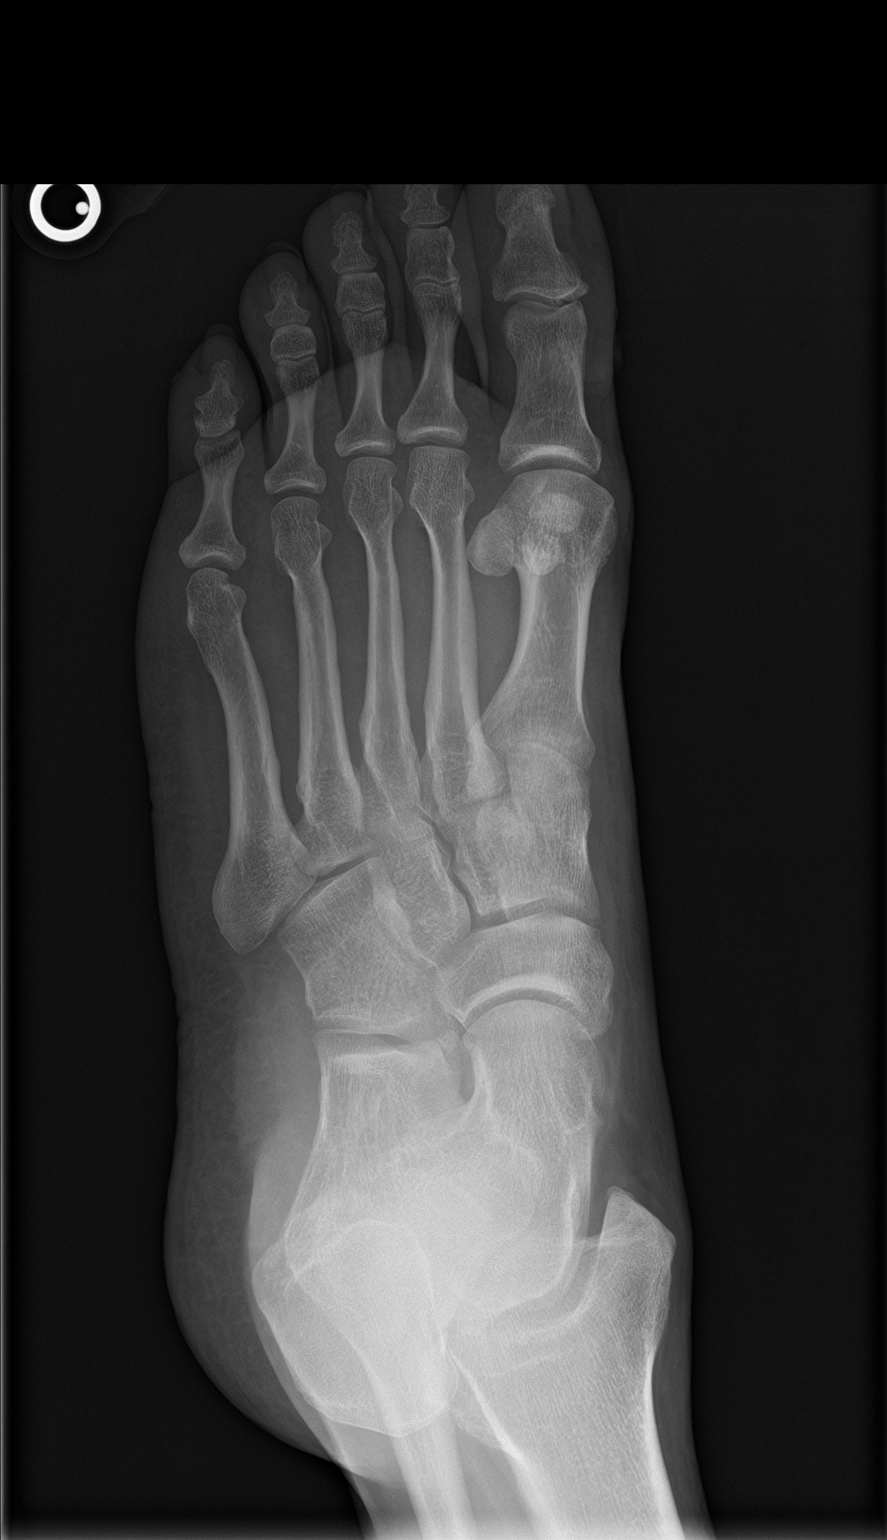

[foot lat]
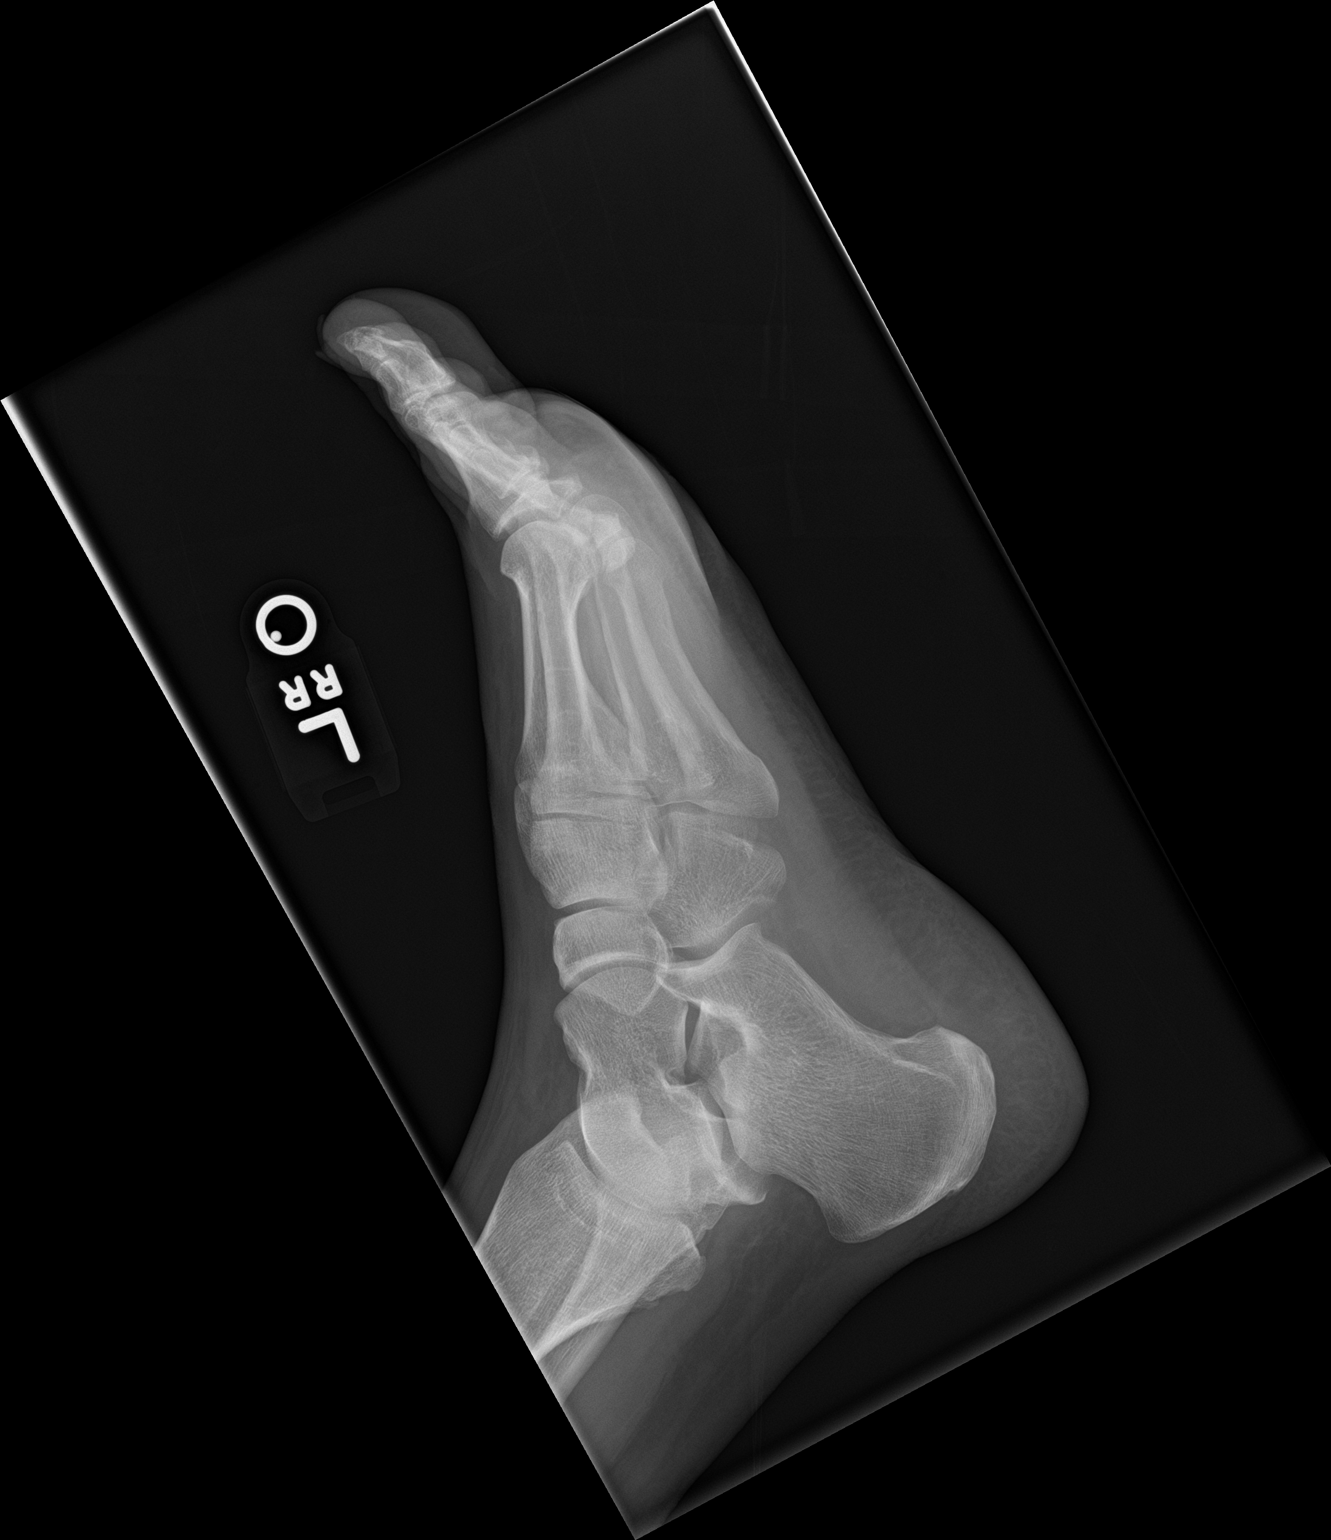

[3 of 3 positions shown; findings below may reference images not displayed]

FINDINGS: There is no evidence of fracture or dislocation. There is no
evidence of arthropathy or other focal bone abnormality. Soft
tissues are unremarkable.
IMPRESSION: Negative.
